# Patient Record
Sex: Female | Born: 1975 | Race: White | Hispanic: Yes | Smoking: Never smoker
Health system: Southern US, Community
[De-identification: ages and names within clinical notes are randomized; demographics above are authoritative.]

## PROBLEM LIST (undated history)

## (undated) DIAGNOSIS — J45909 Unspecified asthma, uncomplicated: Secondary | ICD-10-CM

## (undated) DIAGNOSIS — N189 Chronic kidney disease, unspecified: Secondary | ICD-10-CM

## (undated) DIAGNOSIS — E669 Obesity, unspecified: Secondary | ICD-10-CM

## (undated) DIAGNOSIS — E66811 Obesity, class 1: Secondary | ICD-10-CM

## (undated) DIAGNOSIS — K219 Gastro-esophageal reflux disease without esophagitis: Secondary | ICD-10-CM

## (undated) DIAGNOSIS — E119 Type 2 diabetes mellitus without complications: Secondary | ICD-10-CM

## (undated) DIAGNOSIS — F419 Anxiety disorder, unspecified: Secondary | ICD-10-CM

## (undated) DIAGNOSIS — E785 Hyperlipidemia, unspecified: Secondary | ICD-10-CM

## (undated) DIAGNOSIS — I10 Essential (primary) hypertension: Secondary | ICD-10-CM

## (undated) DIAGNOSIS — F439 Reaction to severe stress, unspecified: Secondary | ICD-10-CM

## (undated) DIAGNOSIS — T7840XA Allergy, unspecified, initial encounter: Secondary | ICD-10-CM

## (undated) HISTORY — DX: Hyperlipidemia, unspecified: E78.5

## (undated) HISTORY — DX: Allergy, unspecified, initial encounter: T78.40XA

## (undated) HISTORY — DX: Anxiety disorder, unspecified: F41.9

## (undated) HISTORY — DX: Obesity, unspecified: E66.9

## (undated) HISTORY — DX: Reaction to severe stress, unspecified: F43.9

## (undated) HISTORY — DX: Chronic kidney disease, unspecified: N18.9

## (undated) HISTORY — DX: Gastro-esophageal reflux disease without esophagitis: K21.9

## (undated) HISTORY — DX: Unspecified asthma, uncomplicated: J45.909

## (undated) HISTORY — DX: Obesity, class 1: E66.811

---

## 1998-04-29 ENCOUNTER — Inpatient Hospital Stay (HOSPITAL_COMMUNITY): Admission: AD | Admit: 1998-04-29 | Discharge: 1998-04-29 | Payer: Self-pay | Admitting: Obstetrics

## 1998-04-30 ENCOUNTER — Inpatient Hospital Stay (HOSPITAL_COMMUNITY): Admission: AD | Admit: 1998-04-30 | Discharge: 1998-04-30 | Payer: Self-pay | Admitting: Obstetrics & Gynecology

## 1998-04-30 ENCOUNTER — Encounter: Payer: Self-pay | Admitting: Obstetrics & Gynecology

## 1998-05-24 ENCOUNTER — Inpatient Hospital Stay (HOSPITAL_COMMUNITY): Admission: AD | Admit: 1998-05-24 | Discharge: 1998-05-26 | Payer: Self-pay | Admitting: Obstetrics & Gynecology

## 2000-02-18 ENCOUNTER — Inpatient Hospital Stay (HOSPITAL_COMMUNITY): Admission: AD | Admit: 2000-02-18 | Discharge: 2000-02-18 | Payer: Self-pay | Admitting: Obstetrics & Gynecology

## 2000-06-01 ENCOUNTER — Inpatient Hospital Stay (HOSPITAL_COMMUNITY): Admission: AD | Admit: 2000-06-01 | Discharge: 2000-06-03 | Payer: Self-pay | Admitting: Obstetrics & Gynecology

## 2000-06-09 ENCOUNTER — Emergency Department (HOSPITAL_COMMUNITY): Admission: EM | Admit: 2000-06-09 | Discharge: 2000-06-09 | Payer: Self-pay | Admitting: Emergency Medicine

## 2003-12-18 ENCOUNTER — Emergency Department (HOSPITAL_COMMUNITY): Admission: EM | Admit: 2003-12-18 | Discharge: 2003-12-18 | Payer: Self-pay

## 2004-01-06 ENCOUNTER — Ambulatory Visit: Payer: Self-pay | Admitting: *Deleted

## 2004-01-12 ENCOUNTER — Ambulatory Visit: Payer: Self-pay | Admitting: Family Medicine

## 2004-01-20 ENCOUNTER — Ambulatory Visit: Payer: Self-pay | Admitting: Family Medicine

## 2004-09-01 ENCOUNTER — Ambulatory Visit: Payer: Self-pay | Admitting: Family Medicine

## 2004-09-30 ENCOUNTER — Ambulatory Visit: Payer: Self-pay | Admitting: Family Medicine

## 2004-11-08 ENCOUNTER — Ambulatory Visit: Payer: Self-pay | Admitting: Family Medicine

## 2005-01-24 ENCOUNTER — Inpatient Hospital Stay (HOSPITAL_COMMUNITY): Admission: AD | Admit: 2005-01-24 | Discharge: 2005-01-24 | Payer: Self-pay | Admitting: Obstetrics & Gynecology

## 2005-01-26 ENCOUNTER — Inpatient Hospital Stay (HOSPITAL_COMMUNITY): Admission: AD | Admit: 2005-01-26 | Discharge: 2005-01-27 | Payer: Self-pay | Admitting: Obstetrics and Gynecology

## 2005-02-22 ENCOUNTER — Ambulatory Visit: Payer: Self-pay | Admitting: Obstetrics & Gynecology

## 2005-03-14 ENCOUNTER — Ambulatory Visit: Payer: Self-pay | Admitting: Family Medicine

## 2005-03-22 ENCOUNTER — Ambulatory Visit: Payer: Self-pay | Admitting: Family Medicine

## 2005-04-28 ENCOUNTER — Ambulatory Visit: Payer: Self-pay | Admitting: Internal Medicine

## 2005-05-11 ENCOUNTER — Ambulatory Visit (HOSPITAL_COMMUNITY): Admission: RE | Admit: 2005-05-11 | Discharge: 2005-05-11 | Payer: Self-pay | Admitting: Internal Medicine

## 2005-05-11 ENCOUNTER — Ambulatory Visit: Payer: Self-pay | Admitting: Family Medicine

## 2005-06-13 ENCOUNTER — Ambulatory Visit: Payer: Self-pay | Admitting: Family Medicine
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
## 2006-03-05 ENCOUNTER — Inpatient Hospital Stay (HOSPITAL_COMMUNITY): Admission: AD | Admit: 2006-03-05 | Discharge: 2006-03-08 | Payer: Self-pay | Admitting: Obstetrics & Gynecology

## 2006-06-02 ENCOUNTER — Ambulatory Visit: Payer: Self-pay | Admitting: Family Medicine

## 2006-08-25 ENCOUNTER — Ambulatory Visit: Payer: Self-pay | Admitting: Family Medicine

## 2006-11-16 ENCOUNTER — Ambulatory Visit: Payer: Self-pay | Admitting: Family Medicine

## 2006-12-20 ENCOUNTER — Ambulatory Visit: Payer: Self-pay | Admitting: Family Medicine

## 2006-12-20 LAB — CONVERTED CEMR LAB
Cholesterol: 162 mg/dL (ref 0–200)
HDL: 58 mg/dL (ref >39)
LDL Cholesterol: 67 mg/dL (ref 0–99)
Total CHOL/HDL Ratio: 2.8
Triglycerides: 187 mg/dL — ABNORMAL HIGH (ref <150)

## 2007-01-03 ENCOUNTER — Encounter (INDEPENDENT_AMBULATORY_CARE_PROVIDER_SITE_OTHER): Payer: Self-pay | Admitting: *Deleted

## 2007-01-10 ENCOUNTER — Ambulatory Visit: Payer: Self-pay | Admitting: Family Medicine

## 2007-02-14 ENCOUNTER — Ambulatory Visit: Payer: Self-pay | Admitting: Internal Medicine

## 2007-03-09 ENCOUNTER — Ambulatory Visit: Payer: Self-pay | Admitting: Family Medicine

## 2008-10-23 ENCOUNTER — Encounter (INDEPENDENT_AMBULATORY_CARE_PROVIDER_SITE_OTHER): Payer: Self-pay | Admitting: Family Medicine

## 2008-10-23 ENCOUNTER — Ambulatory Visit: Payer: Self-pay | Admitting: Family Medicine

## 2010-05-08 ENCOUNTER — Encounter: Payer: Self-pay | Admitting: Obstetrics & Gynecology

## 2011-06-29 ENCOUNTER — Encounter: Payer: Self-pay | Admitting: Obstetrics & Gynecology

## 2011-11-07 ENCOUNTER — Encounter: Payer: Self-pay | Admitting: Obstetrics & Gynecology

## 2011-12-01 ENCOUNTER — Encounter: Payer: Self-pay | Admitting: Obstetrics & Gynecology

## 2011-12-26 ENCOUNTER — Encounter: Payer: Self-pay | Admitting: Obstetrics & Gynecology

## 2012-03-29 ENCOUNTER — Encounter: Payer: Self-pay | Admitting: Family Medicine

## 2013-05-11 ENCOUNTER — Encounter (HOSPITAL_COMMUNITY): Payer: Self-pay | Admitting: Emergency Medicine

## 2013-05-11 ENCOUNTER — Emergency Department (HOSPITAL_COMMUNITY)
Admission: EM | Admit: 2013-05-11 | Discharge: 2013-05-11 | Disposition: A | Discharge status: Home / Self Care | Payer: No Typology Code available for payment source | Attending: Emergency Medicine | Admitting: Emergency Medicine

## 2013-05-11 DIAGNOSIS — S298XXA Other specified injuries of thorax, initial encounter: Secondary | ICD-10-CM | POA: Insufficient documentation

## 2013-05-11 DIAGNOSIS — Y9389 Activity, other specified: Secondary | ICD-10-CM | POA: Insufficient documentation

## 2013-05-11 DIAGNOSIS — S8990XA Unspecified injury of unspecified lower leg, initial encounter: Secondary | ICD-10-CM | POA: Insufficient documentation

## 2013-05-11 DIAGNOSIS — Z79899 Other long term (current) drug therapy: Secondary | ICD-10-CM | POA: Insufficient documentation

## 2013-05-11 DIAGNOSIS — S46909A Unspecified injury of unspecified muscle, fascia and tendon at shoulder and upper arm level, unspecified arm, initial encounter: Secondary | ICD-10-CM | POA: Insufficient documentation

## 2013-05-11 DIAGNOSIS — M79601 Pain in right arm: Secondary | ICD-10-CM

## 2013-05-11 DIAGNOSIS — S239XXA Sprain of unspecified parts of thorax, initial encounter: Secondary | ICD-10-CM | POA: Insufficient documentation

## 2013-05-11 DIAGNOSIS — I10 Essential (primary) hypertension: Secondary | ICD-10-CM | POA: Insufficient documentation

## 2013-05-11 DIAGNOSIS — S39012A Strain of muscle, fascia and tendon of lower back, initial encounter: Secondary | ICD-10-CM

## 2013-05-11 DIAGNOSIS — Y92410 Unspecified street and highway as the place of occurrence of the external cause: Secondary | ICD-10-CM | POA: Insufficient documentation

## 2013-05-11 DIAGNOSIS — S139XXA Sprain of joints and ligaments of unspecified parts of neck, initial encounter: Secondary | ICD-10-CM | POA: Insufficient documentation

## 2013-05-11 DIAGNOSIS — M79602 Pain in left arm: Secondary | ICD-10-CM

## 2013-05-11 DIAGNOSIS — S161XXA Strain of muscle, fascia and tendon at neck level, initial encounter: Secondary | ICD-10-CM

## 2013-05-11 DIAGNOSIS — S4980XA Other specified injuries of shoulder and upper arm, unspecified arm, initial encounter: Secondary | ICD-10-CM | POA: Insufficient documentation

## 2013-05-11 DIAGNOSIS — S99919A Unspecified injury of unspecified ankle, initial encounter: Secondary | ICD-10-CM

## 2013-05-11 DIAGNOSIS — S99929A Unspecified injury of unspecified foot, initial encounter: Secondary | ICD-10-CM

## 2013-05-11 HISTORY — DX: Essential (primary) hypertension: I10

## 2013-05-11 MED ORDER — DIAZEPAM 5 MG PO TABS: 5.0000 mg | ORAL_TABLET | Freq: Two times a day (BID) | ORAL | Status: DC

## 2013-05-11 MED ORDER — IBUPROFEN 600 MG PO TABS: 600.0000 mg | ORAL_TABLET | Freq: Four times a day (QID) | ORAL | Status: DC | PRN

## 2013-05-11 NOTE — ED Notes (Signed)
Pt states understanding of discharge instructions 
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
## 2013-05-12 NOTE — ED Provider Notes (Signed)
Medical screening examination/treatment/procedure(s) were performed by non-physician practitioner and as supervising physician I was immediately available for consultation/collaboration.  EKG Interpretation   None         Jessicamarie Amiri S Samaiyah Howes, MD 05/12/13 0112 

## 2013-10-23 ENCOUNTER — Encounter (HOSPITAL_COMMUNITY): Payer: Self-pay | Admitting: Emergency Medicine

## 2013-10-23 ENCOUNTER — Emergency Department (HOSPITAL_COMMUNITY)
Admission: EM | Admit: 2013-10-23 | Discharge: 2013-10-24 | Disposition: A | Discharge status: Home / Self Care | Payer: No Typology Code available for payment source | Attending: Emergency Medicine | Admitting: Emergency Medicine

## 2013-10-23 DIAGNOSIS — I10 Essential (primary) hypertension: Secondary | ICD-10-CM | POA: Insufficient documentation

## 2013-10-23 DIAGNOSIS — G44209 Tension-type headache, unspecified, not intractable: Secondary | ICD-10-CM | POA: Insufficient documentation

## 2013-10-23 NOTE — ED Notes (Signed)
Patient here with complaint of persistent headache for 2 months. Endorses some nausea at times. Presents tonight because headache has gotten worse.

## 2013-10-24 MED ORDER — TRAMADOL HCL 50 MG PO TABS
50.0000 mg | ORAL_TABLET | Freq: Once | ORAL | Status: AC
  Administered 2013-10-24: 50 mg via ORAL
  Filled 2013-10-24: qty 1

## 2013-10-24 MED ORDER — CYCLOBENZAPRINE HCL 10 MG PO TABS: 10.0000 mg | ORAL_TABLET | Freq: Two times a day (BID) | ORAL | Status: DC | PRN

## 2013-10-24 MED ORDER — IBUPROFEN 800 MG PO TABS
800.0000 mg | ORAL_TABLET | Freq: Once | ORAL | Status: AC
  Administered 2013-10-24: 800 mg via ORAL
  Filled 2013-10-24: qty 1

## 2013-10-24 MED ORDER — IBUPROFEN 600 MG PO TABS: 600.0000 mg | ORAL_TABLET | Freq: Four times a day (QID) | ORAL | Status: DC | PRN

## 2013-10-24 MED ORDER — TRAMADOL HCL 50 MG PO TABS: 50.0000 mg | ORAL_TABLET | Freq: Four times a day (QID) | ORAL | Status: DC | PRN

## 2013-10-24 MED ORDER — CYCLOBENZAPRINE HCL 10 MG PO TABS
5.0000 mg | ORAL_TABLET | Freq: Once | ORAL | Status: AC
  Administered 2013-10-24: 5 mg via ORAL
  Filled 2013-10-24: qty 1

## 2013-10-24 NOTE — ED Notes (Signed)
Patient is non-english speaking. Interpreter phone needed.
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
## 2013-10-24 NOTE — ED Notes (Signed)
Patient was discharged with all personal items.

## 2014-06-02 ENCOUNTER — Encounter (HOSPITAL_COMMUNITY): Payer: Self-pay

## 2014-06-02 ENCOUNTER — Emergency Department (HOSPITAL_COMMUNITY)
Admission: EM | Admit: 2014-06-02 | Discharge: 2014-06-02 | Disposition: A | Discharge status: Home / Self Care | Payer: Self-pay | Attending: Emergency Medicine | Admitting: Emergency Medicine

## 2014-06-02 ENCOUNTER — Emergency Department (HOSPITAL_COMMUNITY): Payer: Self-pay

## 2014-06-02 DIAGNOSIS — I10 Essential (primary) hypertension: Secondary | ICD-10-CM | POA: Insufficient documentation

## 2014-06-02 DIAGNOSIS — Z3202 Encounter for pregnancy test, result negative: Secondary | ICD-10-CM | POA: Insufficient documentation

## 2014-06-02 DIAGNOSIS — R51 Headache: Secondary | ICD-10-CM | POA: Insufficient documentation

## 2014-06-02 DIAGNOSIS — R079 Chest pain, unspecified: Secondary | ICD-10-CM | POA: Insufficient documentation

## 2014-06-02 DIAGNOSIS — R519 Headache, unspecified: Secondary | ICD-10-CM

## 2014-06-02 DIAGNOSIS — Z79899 Other long term (current) drug therapy: Secondary | ICD-10-CM | POA: Insufficient documentation

## 2014-06-02 LAB — BASIC METABOLIC PANEL
Anion gap: 4 — ABNORMAL LOW (ref 5–15)
BUN: 15 mg/dL (ref 6–23)
CO2: 29 mmol/L (ref 19–32)
Calcium: 8.6 mg/dL (ref 8.4–10.5)
Chloride: 104 mmol/L (ref 96–112)
Creatinine, Ser: 1.12 mg/dL — ABNORMAL HIGH (ref 0.50–1.10)
GFR calc Af Amer: 71 mL/min — ABNORMAL LOW (ref >90)
GFR calc non Af Amer: 61 mL/min — ABNORMAL LOW (ref >90)
Glucose, Bld: 100 mg/dL — ABNORMAL HIGH (ref 70–99)
Potassium: 3.6 mmol/L (ref 3.5–5.1)
Sodium: 137 mmol/L (ref 135–145)

## 2014-06-02 LAB — CBC
HCT: 35.6 % — ABNORMAL LOW (ref 36.0–46.0)
Hemoglobin: 11.3 g/dL — ABNORMAL LOW (ref 12.0–15.0)
MCH: 24.7 pg — ABNORMAL LOW (ref 26.0–34.0)
MCHC: 31.7 g/dL (ref 30.0–36.0)
MCV: 77.7 fL — ABNORMAL LOW (ref 78.0–100.0)
Platelets: 247 10*3/uL (ref 150–400)
RBC: 4.58 MIL/uL (ref 3.87–5.11)
RDW: 14.8 % (ref 11.5–15.5)
WBC: 8 10*3/uL (ref 4.0–10.5)

## 2014-06-02 LAB — POC URINE PREG, ED: Preg Test, Ur: NEGATIVE

## 2014-06-02 LAB — I-STAT TROPONIN, ED: Troponin i, poc: 0 ng/mL (ref 0.00–0.08)

## 2014-06-02 MED ORDER — ACETAMINOPHEN 325 MG PO TABS
650.0000 mg | ORAL_TABLET | Freq: Once | ORAL | Status: AC
  Administered 2014-06-02: 650 mg via ORAL
  Filled 2014-06-02: qty 2

## 2014-06-02 MED ORDER — AMLODIPINE BESYLATE 10 MG PO TABS: 10.0000 mg | ORAL_TABLET | Freq: Every day | ORAL | Status: DC

## 2014-06-02 NOTE — ED Provider Notes (Signed)
I was asked to click discharge after negative urine pregnancy   Sharyon Cable, MD 06/02/14 1723

## 2014-06-02 NOTE — ED Provider Notes (Signed)
CSN: [id number]     Arrival date & time 06/02/14  1442 History   First MD Initiated Contact with Patient 06/02/14 1525     Chief Complaint  Patient presents with  . Hypertension  . Headache  . Chest Pain     (Consider location/radiation/quality/duration/timing/severity/associated sxs/prior Treatment) HPI Comments: 39 year old female with high blood pressure, obesity history nonsmoker, no cocaine use, no alcohol abuse presents with headache, elevated blood pressure and transient chest pain. Patient has been out of her high blood pressure meds since October and does not have a primary doctor. Patient has had gradual onset intermittent headache similar to previous blood pressure was elevated at [facility]. Patient had brief chest pressure nonradiating anterior last approximately 30-45 minutes, no exertional component. Patient had brief episode this morning. Translator used for this history. No current chest pain or shortness of breath. No blood clot history, recent surgery, unilateral leg symptoms, estrogen use, hemoptysis.  Patient is a 39 y.o. female presenting with hypertension, headaches, and chest pain. The history is provided by the patient.  Hypertension Associated symptoms include chest pain and headaches. Pertinent negatives include no abdominal pain and no shortness of breath.  Headache Associated symptoms: no abdominal pain, no back pain, no congestion, no cough, no fever, no neck pain, no neck stiffness, no numbness, no vomiting and no weakness   Chest Pain Associated symptoms: headache   Associated symptoms: no abdominal pain, no back pain, no cough, no fever, no numbness, no shortness of breath, not vomiting and no weakness     Past Medical History  Diagnosis Date  . Hypertension    History reviewed. No pertinent past surgical history. History reviewed. No pertinent family history. History  Substance Use Topics  . Smoking status: Never Smoker   . Smokeless tobacco: Not on  file  . Alcohol Use: No   OB History    No data available     Review of Systems  Constitutional: Negative for fever and chills.  HENT: Negative for congestion.   Eyes: Negative for visual disturbance.  Respiratory: Negative for cough and shortness of breath.   Cardiovascular: Positive for chest pain. Negative for leg swelling.  Gastrointestinal: Negative for vomiting and abdominal pain.  Genitourinary: Negative for dysuria and flank pain.  Musculoskeletal: Negative for back pain, neck pain and neck stiffness.  Skin: Negative for rash.  Neurological: Positive for headaches. Negative for weakness, light-headedness and numbness.      Allergies  Review of patient's allergies indicates no known allergies.  Home Medications   Prior to Admission medications   Medication Sig Start Date End Date Taking? Authorizing Provider  acetaminophen (TYLENOL) 500 MG tablet Take 1,000 mg by mouth every 6 (six) hours as needed for moderate pain or headache.    Historical Provider, MD  amLODipine (NORVASC) 10 MG tablet Take 1 tablet (10 mg total) by mouth daily. 06/02/14   Mariea Clonts, MD  cyclobenzaprine (FLEXERIL) 10 MG tablet Take 1 tablet (10 mg total) by mouth 2 (two) times daily as needed for muscle spasms. 10/24/13   Elyn Peers, MD  ibuprofen (ADVIL,MOTRIN) 600 MG tablet Take 1 tablet (600 mg total) by mouth every 6 (six) hours as needed. 10/24/13   Elyn Peers, MD  Multiple Vitamin (MULTIVITAMIN) tablet Take 1 tablet by mouth daily.    Historical Provider, MD  traMADol (ULTRAM) 50 MG tablet Take 1 tablet (50 mg total) by mouth every 6 (six) hours as needed. 10/24/13   Elyn Peers, MD   BP  131/80 mmHg  Pulse 80  Temp(Src) 99.1 F (37.3 C)  Resp 12  SpO2 100% Physical Exam  Constitutional: She is oriented to person, place, and time. She appears well-developed and well-nourished.  HENT:  Head: Normocephalic and atraumatic.  Eyes: Conjunctivae are normal. Right eye exhibits no discharge.  Left eye exhibits no discharge.  Neck: Normal range of motion. Neck supple. No tracheal deviation present.  Cardiovascular: Normal rate, regular rhythm and intact distal pulses.   No murmur heard. Pulmonary/Chest: Effort normal and breath sounds normal.  Abdominal: Soft. She exhibits no distension. There is no tenderness. There is no guarding.  Musculoskeletal: She exhibits no edema.  Neurological: She is alert and oriented to person, place, and time.  Skin: Skin is warm. No rash noted.  Psychiatric: She has a normal mood and affect.  Nursing note and vitals reviewed.   ED Course  Procedures (including critical care time) Labs Review Labs Reviewed  CBC - Abnormal; Notable for the following:    Hemoglobin 11.3 (*)    HCT 35.6 (*)    MCV 77.7 (*)    MCH 24.7 (*)    All other components within normal limits  BASIC METABOLIC PANEL - Abnormal; Notable for the following:    Glucose, Bld 100 (*)    Creatinine, Ser 1.12 (*)    GFR calc non Af Amer 61 (*)    GFR calc Af Amer 71 (*)    Anion gap 4 (*)    All other components within normal limits  I-STAT TROPOININ, ED  POC URINE PREG, ED    Imaging Review Dg Chest 2 View  06/02/2014   CLINICAL DATA:  Headache and chest pain today, history of hypertension, nonsmoker.  EXAM: CHEST  2 VIEW  COMPARISON:  None.  FINDINGS: The lungs are borderline hypoinflated but clear. The heart and pulmonary vascularity are normal. The mediastinum is normal in width. There is no pleural effusion. The bony thorax is unremarkable.  IMPRESSION: There is no active cardiopulmonary disease.   Electronically Signed   By: David  Martinique   On: 06/02/2014 15:51     EKG Interpretation   Date/Time:  Monday June 02 2014 14:49:12 EST Ventricular Rate:  85 PR Interval:  156 QRS Duration: 86 QT Interval:  368 QTC Calculation: 437 R Axis:   -22 Text Interpretation:  Normal sinus rhythm Normal ECG Confirmed by Sylvana Bonk   MD, Deborah Dondero ([id number]) on 06/02/2014 3:32:00 PM       MDM   Final diagnoses:  Chest pain, unspecified chest pain type  Headache, unspecified headache type  Essential hypertension   Well-appearing female, low risk cardiac, very low risk blood clot presents after transient chest pressure this morning. PERC neg. Currently no symptoms except for mild headache gradual onset. Normal neurologic exam. Plan for Tylenol for headache, cardiac screen. Discussed close outpatient follow-up, will start blood pressure medication and stressed reasons to return. EKG and chest x-ray reviewed no acute findings. Vitals unremarkable except for mild elevated blood pressure in the room. UA pending.  Results and differential diagnosis were discussed with the patient/parent/guardian. Close follow up outpatient was discussed, comfortable with the plan.   Medications  acetaminophen (TYLENOL) tablet 650 mg (650 mg Oral Given 06/02/14 1658)    Filed Vitals:   06/02/14 1600 06/02/14 1615 06/02/14 1630 06/02/14 1645  BP: 146/88  131/80   Pulse: 80 72 73 80  Temp:      Resp: 18 14 15 12   SpO2: 100% 100% 100% 100%  Final diagnoses:  Chest pain, unspecified chest pain type  Headache, unspecified headache type  Essential hypertension        Mariea Clonts, MD 06/02/14 1705

## 2014-06-02 NOTE — ED Notes (Signed)
Pt reports being out of hypertension medication since October.  Has not be able to get a PCP.  Sts her head has been hurting her recently and checked her BP at [employer] and it was elevated.  Also reported some chest pressure on Thursday.  Pt CAO x 4 in triage.

## 2014-06-02 NOTE — Discharge Instructions (Signed)
If you were given medicines take as directed.  If you are on coumadin or contraceptives realize their levels and effectiveness is altered by many different medicines.  If you have any reaction (rash, tongues swelling, other) to the medicines stop taking and see a physician.   Please follow up as directed and return to the ER or see a physician for new or worsening symptoms.  Thank you. Filed Vitals:   06/02/14 1447 06/02/14 1538 06/02/14 1539 06/02/14 1540  BP: 198/97 135/79    Pulse: 98   75  Temp: 99.1 F (37.3 C)     Resp: 18  21 15   SpO2: 99%   100%

## 2014-07-14 ENCOUNTER — Encounter: Payer: Self-pay | Admitting: Internal Medicine

## 2014-07-14 ENCOUNTER — Ambulatory Visit: Payer: Self-pay | Attending: Internal Medicine | Admitting: Internal Medicine

## 2014-07-14 VITALS — BP 125/85 | HR 84 | Temp 97.4°F | Resp 16 | Ht 62.0 in | Wt 182.0 lb

## 2014-07-14 DIAGNOSIS — Z8049 Family history of malignant neoplasm of other genital organs: Secondary | ICD-10-CM | POA: Insufficient documentation

## 2014-07-14 DIAGNOSIS — Z6833 Body mass index (BMI) 33.0-33.9, adult: Secondary | ICD-10-CM | POA: Insufficient documentation

## 2014-07-14 DIAGNOSIS — I10 Essential (primary) hypertension: Secondary | ICD-10-CM

## 2014-07-14 DIAGNOSIS — L83 Acanthosis nigricans: Secondary | ICD-10-CM

## 2014-07-14 DIAGNOSIS — Z713 Dietary counseling and surveillance: Secondary | ICD-10-CM | POA: Insufficient documentation

## 2014-07-14 DIAGNOSIS — N6002 Solitary cyst of left breast: Secondary | ICD-10-CM

## 2014-07-14 DIAGNOSIS — E8881 Metabolic syndrome: Secondary | ICD-10-CM | POA: Insufficient documentation

## 2014-07-14 DIAGNOSIS — L918 Other hypertrophic disorders of the skin: Secondary | ICD-10-CM

## 2014-07-14 DIAGNOSIS — L304 Erythema intertrigo: Secondary | ICD-10-CM

## 2014-07-14 DIAGNOSIS — B351 Tinea unguium: Secondary | ICD-10-CM

## 2014-07-14 DIAGNOSIS — Z833 Family history of diabetes mellitus: Secondary | ICD-10-CM | POA: Insufficient documentation

## 2014-07-14 LAB — COMPLETE METABOLIC PANEL WITH GFR
ALBUMIN: 3.8 g/dL (ref 3.5–5.2)
ALT: 17 U/L (ref 0–35)
AST: 15 U/L (ref 0–37)
Alkaline Phosphatase: 74 U/L (ref 39–117)
BILIRUBIN TOTAL: 0.3 mg/dL (ref 0.2–1.2)
BUN: 19 mg/dL (ref 6–23)
CHLORIDE: 104 meq/L (ref 96–112)
CO2: 27 meq/L (ref 19–32)
CREATININE: 0.84 mg/dL (ref 0.50–1.10)
Calcium: 8.7 mg/dL (ref 8.4–10.5)
GFR, EST NON AFRICAN AMERICAN: 88 mL/min
GFR, Est African American: >89 mL/min
Glucose, Bld: 118 mg/dL — ABNORMAL HIGH (ref 70–99)
Potassium: 4.5 mEq/L (ref 3.5–5.3)
SODIUM: 141 meq/L (ref 135–145)
Total Protein: 7.1 g/dL (ref 6.0–8.3)

## 2014-07-14 LAB — HEMOGLOBIN A1C
Hgb A1c MFr Bld: 6.8 % — ABNORMAL HIGH (ref <5.7)
Mean Plasma Glucose: 148 mg/dL — ABNORMAL HIGH (ref <117)

## 2014-07-14 LAB — LIPID PANEL
Cholesterol: 168 mg/dL (ref 0–200)
HDL: 52 mg/dL (ref >=46)
LDL CALC: 84 mg/dL (ref 0–99)
TRIGLYCERIDES: 158 mg/dL — AB (ref <150)
Total CHOL/HDL Ratio: 3.2 Ratio
VLDL: 32 mg/dL (ref 0–40)

## 2014-07-14 LAB — TSH: TSH: 2.445 u[IU]/mL (ref 0.350–4.500)

## 2014-07-14 MED ORDER — LOSARTAN POTASSIUM 50 MG PO TABS: 50.0000 mg | ORAL_TABLET | Freq: Every day | ORAL | Status: DC

## 2014-07-14 MED ORDER — NYSTATIN 100000 UNIT/GM EX POWD: CUTANEOUS | Status: DC

## 2014-07-14 NOTE — Progress Notes (Signed)
Patient ID: Jennifer Dunn, female   DOB: [birth year], 39 y.o.   MRN: [id number]  ZTI:[id number]  ASN:[id number]  DOB - Aug 21, 1975  CC:  Chief Complaint  Patient presents with  . Establish Care       HPI: Jennifer Dunn is a 39 y.o. female here today to establish medical care.  She has a past medical history of hypertension and is on amlodipine. She notes that she has had some ankle swelling since beginning amlodipine last month. She reports that she has been having left breast tenderness for the past 3-4 months.  The pain is not associated with menstrual periods.  The pain is sharp and happens several times per day. She noticed a knot in her breast. She denies any family history of breast cancer but admits to a family history of uterine cancer. Last pap smear was 2 years ago which was normal. Patient's last menstrual period was 06/30/2014.   Strong family history of DM. She denies symptoms of blurred vision, dizziness, polyuria, polydipsia. She is concerned about dark, brittle toenails for the past 2 years.    Patient has No headache, No chest pain, No abdominal pain - No Nausea, No new weakness tingling or numbness, No Cough - SOB.  No Known Allergies Past Medical History  Diagnosis Date  . Hypertension    Current Outpatient Prescriptions on File Prior to Visit  Medication Sig Dispense Refill  . acetaminophen (TYLENOL) 500 MG tablet Take 1,000 mg by mouth every 6 (six) hours as needed for moderate pain or headache.    Marland Kitchen amLODipine (NORVASC) 10 MG tablet Take 1 tablet (10 mg total) by mouth daily. 60 tablet 1  . cyclobenzaprine (FLEXERIL) 10 MG tablet Take 1 tablet (10 mg total) by mouth 2 (two) times daily as needed for muscle spasms. (Patient not taking: Reported on 07/14/2014) 20 tablet 0  . ibuprofen (ADVIL,MOTRIN) 600 MG tablet Take 1 tablet (600 mg total) by mouth every 6 (six) hours as needed. (Patient not taking: Reported on 07/14/2014) 30 tablet 0  . Multiple Vitamin  (MULTIVITAMIN) tablet Take 1 tablet by mouth daily.    . traMADol (ULTRAM) 50 MG tablet Take 1 tablet (50 mg total) by mouth every 6 (six) hours as needed. (Patient not taking: Reported on 07/14/2014) 15 tablet 0   No current facility-administered medications on file prior to visit.   History reviewed. No pertinent family history. History   Social History  . Marital Status: Single    Spouse Name: N/A  . Number of Children: N/A  . Years of Education: N/A   Occupational History  . Not on file.   Social History Main Topics  . Smoking status: Never Smoker   . Smokeless tobacco: Not on file  . Alcohol Use: No  . Drug Use: No  . Sexual Activity: Not on file   Other Topics Concern  . Not on file   Social History Narrative    Review of Systems: See HPI.    Objective:   Filed Vitals:   07/14/14 0916  BP: 125/85  Pulse: 84  Temp: 97.4 F (36.3 C)  Resp: 16    Physical Exam  Constitutional: She is oriented to person, place, and time.  HENT:  Acanthosis nigracans  Neck: No thyromegaly present.  Cardiovascular: Normal rate, regular rhythm and normal heart sounds.   Pulmonary/Chest: Effort normal and breath sounds normal. Right breast exhibits no mass, no nipple discharge, no skin change and no tenderness. Left breast exhibits mass and tenderness.  Left breast exhibits no nipple discharge and no skin change.    Abdominal: Soft. Bowel sounds are normal.  Musculoskeletal: She exhibits edema (trace BLE).  Neurological: She is alert and oriented to person, place, and time.  Skin: Skin is warm and dry.  Several skin tags under bilateral axilla with hyperpigmentations  Psychiatric: She has a normal mood and affect.     Lab Results  Component Value Date   WBC 8.0 06/02/2014   HGB 11.3* 06/02/2014   HCT 35.6* 06/02/2014   MCV 77.7* 06/02/2014   PLT 247 06/02/2014   Lab Results  Component Value Date   CREATININE 1.12* 06/02/2014   BUN 15 06/02/2014   NA 137  06/02/2014   K 3.6 06/02/2014   CL 104 06/02/2014   CO2 29 06/02/2014    No results found for: HGBA1C Lipid Panel     Component Value Date/Time   CHOL 162 12/20/2006 2229   TRIG 187* 12/20/2006 2229   HDL 58 12/20/2006 2229   CHOLHDL 2.8 Ratio 12/20/2006 2229   VLDL 37 12/20/2006 2229   LDLCALC 67 12/20/2006 2229       Assessment and plan:   Jennifer Dunn was seen today for establish care.  Diagnoses and all orders for this visit:  Essential hypertension Orders: -     losartan (COZAAR) 50 MG tablet; Take 1 tablet (50 mg total) by mouth daily. Will d/c amlodipine due to lower extremity swelling. Will switch to losartan and reassess in 2 weeks. Explained to patient diet modifications that may contribute to elevated BP. Patient will avoid foods that are high in sodium such as  canned soups and vegetables, tomato juice, commercial baked goods, commercially prepared frozen or canned entrees and sauces. Avoid salty snacks, added salt when cooking, and substituting for low sodium herbs or spices Explained exercise regimen of cardio at least three times weekly to help lower BP and cholesterol.  Acanthosis nigricans Orders: -     Hemoglobin A1c Patient shows early signs of insulin resistance   Morbid obesity Weight loss discussed at length and its complications to health.  Patient will loss 10 months by next visit in 3 months.  Diet and exercise discussed as well as calorie intake.  Breast cyst, left Orders: -     MM Digital Diagnostic Bilat; Future  Skin tag Orders: -     Ambulatory referral to Dermatology  Onychomycosis Orders: -     COMPLETE METABOLIC PANEL WITH GFR -     Lipid panel -     TSH -     Vitamin D, 25-hydroxy Will send oral lamisil once LFT's result  Intertrigo Orders: -     nystatin (MYCOSTATIN/NYSTOP) 100000 UNIT/GM POWD; May use under arms/breast twice daily   Return in about 2 weeks (around 07/28/2014) for pap smear.    Chari Manning, [facility] [phone] 07/14/2014, 9:59 AM

## 2014-07-14 NOTE — Patient Instructions (Signed)
Leodis Sias  (Breast Sonogram) Es una ecografa de la mama para determinar si un bulto es un quiste (una cavidad llena de lquido), una masa slida o un tumor. Se indica para diagnosticar si un tumor es benigno (no canceroso) o canceroso. Tambin permite localizar un ndulo pequeo que no puede palparse, de modo que pueda localizarse para ser extirpado [employer].  PREPARACIN PARA EL ESTUDIO  No se requiere una preparacin especial.  HALLAZGOS NORMALES:  No hay evidencia de quiste o tumor.  Los rangos para los resultados normales pueden variar entre diferentes laboratorios y hospitales. Consulte siempre con su mdico despus de Psychologist, counselling estudio para Armed forces logistics/support/administrative officer significado de los [place] y si los valores se consideran "dentro de los lmites normales".  SIGNIFICADO DEL ESTUDIO  El mdico leer los resultados y Teacher, English as a foreign language con usted la importancia y el significado de los [place], as como las opciones de tratamiento y la necesidad de pruebas adicionales, si fuera necesario.  OBTENCIN DE LOS RESULTADOS DE LAS PRUEBAS  Es su responsabilidad retirar el resultado del [place]. Consulte en el laboratorio cundo y cmo podr [employer].  Document Released: 07/30/2012 [facility] Patient Information 2015 [place]. This information is not intended to replace advice given to you by your health care provider. Make sure you discuss any questions you have with your health care provider.

## 2014-07-14 NOTE — Progress Notes (Signed)
Pt is here to establish care. Pt has a history of HTN. Pt is c/o tenderness in her left breast.

## 2014-07-15 ENCOUNTER — Telehealth (HOSPITAL_COMMUNITY): Payer: Self-pay | Admitting: *Deleted

## 2014-07-15 LAB — VITAMIN D 25 HYDROXY (VIT D DEFICIENCY, FRACTURES): VIT D 25 HYDROXY: 16 ng/mL — AB (ref 30–100)

## 2014-07-15 NOTE — Telephone Encounter (Signed)
Telephoned patient at home # and was not a valid #.

## 2014-07-22 ENCOUNTER — Telehealth (HOSPITAL_COMMUNITY): Payer: Self-pay | Admitting: *Deleted

## 2014-07-22 ENCOUNTER — Other Ambulatory Visit: Payer: Self-pay | Admitting: Internal Medicine

## 2014-07-22 DIAGNOSIS — N6002 Solitary cyst of left breast: Secondary | ICD-10-CM

## 2014-07-22 NOTE — Telephone Encounter (Signed)
Telephoned patient at home # and # is invalid.

## 2014-07-28 ENCOUNTER — Other Ambulatory Visit: Payer: Self-pay | Admitting: Internal Medicine

## 2014-07-31 ENCOUNTER — Ambulatory Visit: Payer: Self-pay

## 2014-08-05 ENCOUNTER — Telehealth: Payer: Self-pay | Admitting: *Deleted

## 2014-08-05 NOTE — Telephone Encounter (Signed)
-----   Message from Lance Bosch, NP sent at 07/25/2014 10:13 PM EDT ----- patient tested positive for diabetes mellitus type 2. Please provide patient with appropriate education and begin her on metformin XR 500 mg once daily with breakfast. Please have patient come back for nurse visit with Lauren for appropriate education. Reason for patient that metformin may cause some GI upset for the first week but should resolve after that. Cholesterol looks good.  Vitamin D is low. Please send drisdol 50,000 IU to take once weekly for 10 weeks. 10 tablets no refills.

## 2014-08-05 NOTE — Telephone Encounter (Signed)
Phone number provided does not work.

## 2014-08-14 ENCOUNTER — Ambulatory Visit: Payer: No Typology Code available for payment source | Attending: Internal Medicine | Admitting: Internal Medicine

## 2014-08-14 ENCOUNTER — Encounter: Payer: Self-pay | Admitting: Internal Medicine

## 2014-08-14 VITALS — BP 153/109 | HR 85 | Temp 98.0°F | Resp 16

## 2014-08-14 DIAGNOSIS — B351 Tinea unguium: Secondary | ICD-10-CM

## 2014-08-14 DIAGNOSIS — N925 Other specified irregular menstruation: Secondary | ICD-10-CM | POA: Insufficient documentation

## 2014-08-14 DIAGNOSIS — Z124 Encounter for screening for malignant neoplasm of cervix: Secondary | ICD-10-CM

## 2014-08-14 DIAGNOSIS — L304 Erythema intertrigo: Secondary | ICD-10-CM

## 2014-08-14 DIAGNOSIS — E119 Type 2 diabetes mellitus without complications: Secondary | ICD-10-CM

## 2014-08-14 DIAGNOSIS — I10 Essential (primary) hypertension: Secondary | ICD-10-CM | POA: Insufficient documentation

## 2014-08-14 LAB — POCT URINALYSIS DIPSTICK
Bilirubin, UA: NEGATIVE
Glucose, UA: NEGATIVE
Ketones, UA: NEGATIVE
NITRITE UA: NEGATIVE
Protein, UA: >=300
Spec Grav, UA: >=1.03
UROBILINOGEN UA: 0.2
pH, UA: 5.5

## 2014-08-14 LAB — POCT URINE PREGNANCY: Preg Test, Ur: NEGATIVE

## 2014-08-14 MED ORDER — METFORMIN HCL ER 500 MG PO TB24: 500.0000 mg | ORAL_TABLET | Freq: Every day | ORAL | Status: DC

## 2014-08-14 MED ORDER — FREESTYLE LANCETS MISC: Status: DC

## 2014-08-14 MED ORDER — FREESTYLE SYSTEM KIT: 1.0000 | PACK | Status: DC | PRN

## 2014-08-14 MED ORDER — GLUCOSE BLOOD VI STRP: ORAL_STRIP | Status: DC

## 2014-08-14 MED ORDER — NYSTATIN 100000 UNIT/GM EX POWD: CUTANEOUS | Status: DC

## 2014-08-14 MED ORDER — TERBINAFINE HCL 250 MG PO TABS: 250.0000 mg | ORAL_TABLET | Freq: Every day | ORAL | Status: DC

## 2014-08-14 NOTE — Patient Instructions (Addendum)
Please call Rolena Infante, [phone],  with the BCCCP (breast and cervical cancer control program) at the [facility] to set up an appointment to verify eligibility for a breast exam, mammogram, ultrasound. If you qualify this will be set up at [facility].  Diabetes y normas bsicas de atencin mdica (Diabetes and Standards of Medical Care) La diabetes es una enfermedad complicada. El equipo que trate su diabetes deber incluir un nutricionista, un enfermero, un educador para la diabetes, un oftalmlogo y ms. Para que todas las personas conozcan sobre su enfermedad y para que los pacientes tengan los cuidados que necesitan, se crearon las siguientes normas bsicas para un mejor control. A continuacin se indican los estudios, vacunas, medicamentos, educacin y planes que necesitar. Prueba de HbA1c Esta prueba muestra cmo ha sido controlada su glucosa en los ltimos 2 o 3 meses. Se utiliza para verificar si el plan de control de la diabetes debe ser ajustado.   Hgalos al menos 2 veces al ao si cumple los objetivos del [place].  Si le han cambiado el tratamiento o si no cumple con los objetivos del [place], debe hacerlo 4 veces al ao. Control de la presin arterial.  Hgalas en cada visita mdica de rutina. El objetivo es tener menos de 140/90 mm Hg en la mayora de las personas, pero 130/80 mm Hg en algunos casos. Consulte a su mdico acerca de su objetivo. Examen dental.  Concurra regularmente a las visitas de control con el dentista. Examen ocular.  Si le diagnosticaron diabetes tipo 1 siendo un nio, debe hacerse estudios al llegar a los 10 aos o ms y si ha sufrido de diabetes durante 3 a 5 aos. Se recomienda hacer anualmente los exmenes oculares despus de ese examen inicial.  Si le diagnosticaron diabetes tipo 1 siendo adulto, hgase un examen dentro de los 5 aos del diagnstico y luego una vez por ao.  Si le diagnosticaron diabetes tipo 2, hgase un estudio lo  antes posible despus del diagnstico y luego una vez por ao. Examen de los pies  Se har una inspeccin visual en cada visita mdica de rutina. Estos controles observarn si hay cortes, lesiones u otros problemas en los pies.  Una vez por ao debe hacerse un examen ms intensivo. Este examen incluye la inspeccin visual y Ardelia Mems evaluacin de los pulsos del pie y la sensibilidad.  Contrlese los pies todas las noches para ver si hay cortes, lesiones u otros problemas. Comunquese con su mdico si observa que no se curan. Estudio de la funcin renal (microalbmina en orina)  Debe realizarse una vez por ao.  Diabetes tipo 1: La primera prueba se realiza a los 5 aos despus del diagnstico.  Diabetes tipo2: La primera prueba se realiza en el momento del diagnstico.  La creatinina srica y el ndice de filtracin glomerular estimada (eGFR, por sus siglas en ingls) se realizan una vez por ao para informar el nivel de enfermedad renal crnica, si la hubiera. Perfil lipdico (colesterol, HDL, LDL, triglicridos).  La mayora de las personas lo hacen cada 5 aos.  En relacin al LDL, el objetivo es tener menos de 100 mg/dl. Si tiene alto riesgo, el objetivo es tener menos de 70 mg/dl.  En relacin al HDL, el objetivo es [employer] 40 y 54 mg/dl para los hombres y entre 14 y 73 mg/dl para las mujeres. Un nivel de colesterol HDL de 60 mg/dl o superior da una cierta proteccin contra la enfermedad cardaca.  En relacin a los  triglicridos, el objetivo es tener menos de 150 mg/dl. Vacuna contra la gripe, contra el neumococo y contra la hepatitis B  Se recomienda aplicarse la vacuna contra la gripe anualmente.  Se recomienda que las [employer] de 65aos con diabetes se apliquen la vacuna contra la neumona. En algunos casos, pueden administrarse dos inyecciones separadas. Pregntele al mdico si tiene la vacuna contra la neumona al da.  Tambin se recomienda la aplicacin de la  vacuna contra la hepatitis B en los adultos con diabetes. Educacin para el autocontrol de la diabetes  Recomendaciones al momento del diagnstico y los controles segn sea necesario. Plan de tratamiento  Su plan de tratamiento ser revisado en cada visita mdica. Document Released: 06/29/2009 Document Revised: 08/19/2013 [facility] Patient Information 2015 [place]. This information is not intended to replace advice given to you by your health care provider. Make sure you discuss any questions you have with your health care provider.

## 2014-08-14 NOTE — Progress Notes (Signed)
Patient ID: Jennifer Dunn, female   DOB: 29-Mar-1976, 38 y.o.   MRN: [id number]  CC: pap, toe fungus, mammogram  HPI: Jennifer Dunn is a 39 y.o. female here today for a follow up visit.  Patient has past medical history of hypertension and T2DM.  She reports that she has been having irregular periods with the last one on 08/10/14 lasting only one day.  Patient is requesting a mammogram. Patient would like a pap smear today for vaginal itching. She has not had a pap smear in the past 2 years and has no history of abnormal paps. She denies vaginal discharge, odor, or lesions. No new sexual partners.  She is concerned with a toe nail fungus on bilateral feet that have been present for a while. She c/o long thick toenails that are occasionaly itchy.    Patient has No headache, No chest pain, No abdominal pain - No Nausea, No new weakness tingling or numbness, No Cough - SOB.  No Known Allergies Past Medical History  Diagnosis Date  . Hypertension    Current Outpatient Prescriptions on File Prior to Visit  Medication Sig Dispense Refill  . losartan (COZAAR) 50 MG tablet Take 1 tablet (50 mg total) by mouth daily. 30 tablet 3  . acetaminophen (TYLENOL) 500 MG tablet Take 1,000 mg by mouth every 6 (six) hours as needed for moderate pain or headache.    . cyclobenzaprine (FLEXERIL) 10 MG tablet Take 1 tablet (10 mg total) by mouth 2 (two) times daily as needed for muscle spasms. (Patient not taking: Reported on 07/14/2014) 20 tablet 0  . ibuprofen (ADVIL,MOTRIN) 600 MG tablet Take 1 tablet (600 mg total) by mouth every 6 (six) hours as needed. (Patient not taking: Reported on 07/14/2014) 30 tablet 0  . Multiple Vitamin (MULTIVITAMIN) tablet Take 1 tablet by mouth daily.    Marland Kitchen nystatin (MYCOSTATIN/NYSTOP) 100000 UNIT/GM POWD May use under arms/breast twice daily (Patient not taking: Reported on 08/14/2014) 60 g 0  . traMADol (ULTRAM) 50 MG tablet Take 1 tablet (50 mg total) by mouth every 6  (six) hours as needed. (Patient not taking: Reported on 07/14/2014) 15 tablet 0   No current facility-administered medications on file prior to visit.   History reviewed. No pertinent family history. History   Social History  . Marital Status: Single    Spouse Name: N/A  . Number of Children: N/A  . Years of Education: N/A   Occupational History  . Not on file.   Social History Main Topics  . Smoking status: Never Smoker   . Smokeless tobacco: Not on file  . Alcohol Use: No  . Drug Use: No  . Sexual Activity: Not on file   Other Topics Concern  . Not on file   Social History Narrative    Review of Systems  Eyes: Positive for blurred vision.  Cardiovascular: Positive for claudication.  Genitourinary: Positive for frequency.  Skin: Positive for rash.  Neurological: Positive for tingling.  Endo/Heme/Allergies: Positive for polydipsia.  All other systems reviewed and are negative.    Objective:   Filed Vitals:   08/14/14 1639  BP: 153/109  Pulse: 85  Temp: 98 F (36.7 C)  Resp: 16    Physical Exam  Constitutional: She is oriented to person, place, and time.  Cardiovascular: Normal rate, regular rhythm and normal heart sounds.   Pulmonary/Chest: Effort normal and breath sounds normal. Right breast exhibits no mass and no skin change. Left breast exhibits no mass and no  skin change.  Abdominal: Soft. Bowel sounds are normal.  Genitourinary: Vagina normal and uterus normal. No breast tenderness or discharge. Cervix exhibits no motion tenderness, no discharge and no friability. Right adnexum displays no tenderness. Left adnexum displays no tenderness.  Lymphadenopathy:       Right: No inguinal adenopathy present.       Left: No inguinal adenopathy present.  Neurological: She is alert and oriented to person, place, and time.  Skin: Skin is warm and dry.     Lab Results  Component Value Date   WBC 8.0 06/02/2014   HGB 11.3* 06/02/2014   HCT 35.6* 06/02/2014    MCV 77.7* 06/02/2014   PLT 247 06/02/2014   Lab Results  Component Value Date   CREATININE 0.84 07/14/2014   BUN 19 07/14/2014   NA 141 07/14/2014   K 4.5 07/14/2014   CL 104 07/14/2014   CO2 27 07/14/2014    Lab Results  Component Value Date   HGBA1C 6.8* 07/14/2014   Lipid Panel     Component Value Date/Time   CHOL 168 07/14/2014 1045   TRIG 158* 07/14/2014 1045   HDL 52 07/14/2014 1045   CHOLHDL 3.2 07/14/2014 1045   VLDL 32 07/14/2014 1045   LDLCALC 84 07/14/2014 1045       Assessment and plan:  Jennifer Dunn was seen today for follow-up.  Diagnoses and all orders for this visit:  Type 2 diabetes mellitus without complication Orders: -     glucose monitoring kit (FREESTYLE) monitoring kit; 1 each by Does not apply route as needed. -     glucose blood test strip; Use as instructed -     Lancets (FREESTYLE) lancets; Use as instructed -   Refill metFORMIN (GLUCOPHAGE XR) 500 MG 24 hr tablet; Take 1 tablet (500 mg total) by mouth daily with breakfast. -     Microalbumin, urine -     POCT urine pregnancy -     Cytology - PAP [place] -     COMPLETE METABOLIC PANEL WITH GFR; Future  Intertrigo Orders: -     nystatin (MYCOSTATIN/NYSTOP) 100000 UNIT/GM POWD; May use under arms/breast twice daily  Papanicolaou smear Orders: -     POCT urinalysis dipstick -     Cervicovaginal ancillary only -     Cervicovaginal ancillary only  Onychomycosis Orders: -     terbinafine (LAMISIL) 250 MG tablet; Take 1 tablet (250 mg total) by mouth daily. Will return in one month for LFT test. At that time I will give patient 2 more months of medication to complete therapy.  Return in about 4 weeks (around 09/11/2014) for Lab Visit AND 3 MO pcp.       Chari Manning, NP-C [facility] [phone] 08/14/2014, 5:04 PM

## 2014-08-14 NOTE — Progress Notes (Signed)
Pt is here for a pap smear. Pt has a fungus infection on all her toenails. Pt reports having abdomen pain for 3 days. Pt said that she was suppose to have a MM but never received an appointment.

## 2014-08-15 LAB — MICROALBUMIN, URINE: MICROALB UR: 143.5 mg/dL — AB (ref <2.0)

## 2014-08-15 LAB — CERVICOVAGINAL ANCILLARY ONLY: Wet Prep (BD Affirm): NEGATIVE

## 2014-08-18 LAB — CYTOLOGY - PAP

## 2014-08-18 LAB — CERVICOVAGINAL ANCILLARY ONLY
CHLAMYDIA, DNA PROBE: NEGATIVE
Neisseria Gonorrhea: NEGATIVE

## 2014-08-19 ENCOUNTER — Telehealth: Payer: Self-pay | Admitting: *Deleted

## 2014-08-19 NOTE — Telephone Encounter (Signed)
Pt's phone not accepting VM. Unable to leave message.

## 2014-08-19 NOTE — Telephone Encounter (Signed)
-----   Message from Lance Bosch, NP sent at 08/18/2014  7:26 PM EDT ----- Negative for infections on pap, still waiting on cytology

## 2014-08-20 ENCOUNTER — Telehealth: Payer: Self-pay | Admitting: Internal Medicine

## 2014-08-20 NOTE — Telephone Encounter (Signed)
Patient called returning nurse's phone call to review results. Please f/u °

## 2014-08-25 ENCOUNTER — Telehealth: Payer: Self-pay | Admitting: *Deleted

## 2014-08-25 NOTE — Telephone Encounter (Signed)
-----   Message from Lance Bosch, NP sent at 08/21/2014  7:48 AM EDT ----- Negative cytology

## 2014-08-25 NOTE — Telephone Encounter (Signed)
Phone numbers provided are not working.

## 2014-09-03 ENCOUNTER — Ambulatory Visit: Payer: Self-pay | Attending: Internal Medicine

## 2014-09-03 NOTE — Telephone Encounter (Signed)
Patient came into office requesting pap smear results, please f/u with patient

## 2014-09-08 ENCOUNTER — Other Ambulatory Visit (HOSPITAL_COMMUNITY): Payer: Self-pay | Admitting: *Deleted

## 2014-09-08 DIAGNOSIS — N6320 Unspecified lump in the left breast, unspecified quadrant: Secondary | ICD-10-CM

## 2014-09-12 ENCOUNTER — Ambulatory Visit: Payer: No Typology Code available for payment source | Attending: Internal Medicine

## 2014-09-12 DIAGNOSIS — E119 Type 2 diabetes mellitus without complications: Secondary | ICD-10-CM

## 2014-09-12 LAB — COMPLETE METABOLIC PANEL WITH GFR
ALBUMIN: 3.5 g/dL (ref 3.5–5.2)
ALT: 16 U/L (ref 0–35)
AST: 16 U/L (ref 0–37)
Alkaline Phosphatase: 88 U/L (ref 39–117)
BILIRUBIN TOTAL: 0.3 mg/dL (ref 0.2–1.2)
BUN: 14 mg/dL (ref 6–23)
CO2: 28 mEq/L (ref 19–32)
Calcium: 8.9 mg/dL (ref 8.4–10.5)
Chloride: 102 mEq/L (ref 96–112)
Creat: 0.87 mg/dL (ref 0.50–1.10)
GFR, EST NON AFRICAN AMERICAN: 84 mL/min
GFR, Est African American: >89 mL/min
Glucose, Bld: 96 mg/dL (ref 70–99)
POTASSIUM: 4.4 meq/L (ref 3.5–5.3)
SODIUM: 139 meq/L (ref 135–145)
Total Protein: 7.1 g/dL (ref 6.0–8.3)

## 2014-09-15 ENCOUNTER — Other Ambulatory Visit: Payer: Self-pay | Admitting: Internal Medicine

## 2014-09-15 DIAGNOSIS — B351 Tinea unguium: Secondary | ICD-10-CM

## 2014-09-15 MED ORDER — TERBINAFINE HCL 250 MG PO TABS: 250.0000 mg | ORAL_TABLET | Freq: Every day | ORAL | Status: DC

## 2014-09-17 ENCOUNTER — Telehealth: Payer: Self-pay | Admitting: *Deleted

## 2014-09-17 NOTE — Telephone Encounter (Signed)
-----   Message from Lance Bosch, NP sent at 09/15/2014 12:49 PM EDT ----- Liver test is normal, I have sent in last 2 months of Lamisil for foot fungus.

## 2014-09-17 NOTE — Telephone Encounter (Signed)
PI [id number] and results relayed to patient.  Patient states she will pick up the medication.

## 2014-09-18 ENCOUNTER — Ambulatory Visit (HOSPITAL_COMMUNITY): Payer: No Typology Code available for payment source

## 2014-09-18 ENCOUNTER — Other Ambulatory Visit: Payer: Self-pay

## 2014-10-09 ENCOUNTER — Ambulatory Visit (HOSPITAL_COMMUNITY)
Admission: RE | Admit: 2014-10-09 | Discharge: 2014-10-09 | Disposition: A | Discharge status: Home / Self Care | Payer: No Typology Code available for payment source | Source: Ambulatory Visit | Attending: Obstetrics and Gynecology | Admitting: Obstetrics and Gynecology

## 2014-10-09 DIAGNOSIS — Z1239 Encounter for other screening for malignant neoplasm of breast: Secondary | ICD-10-CM

## 2014-10-09 NOTE — Progress Notes (Signed)
CLINIC:  [facility]) [facility]  REASON FOR VISIT: Well-woman exam and diagnostic mammogram.    HISTORY OF PRESENT ILLNESS:  [name] is a 39 y.o. female who presents to the [facility] today for clinical breast exam. No family history of breast cancer. She reports a left breast lump x 6 months that is intermittently painful to 7-8/10.  She denies any breast skin changes or nipple discharge.  Her last pap smear was in 07/2014 and was negative.  She has no history of abnormal pap smears.   REVIEW OF SYSTEMS:  Left breast lump per HPI. Otherwise, no other complaints. Denies any nodularity, skin changes, or nipple inversion/discharge to the right breast.   ALLERGIES: No Known Allergies  CURRENT MEDICATIONS:  Current Outpatient Prescriptions on File Prior to Encounter  Medication Sig Dispense Refill  . acetaminophen (TYLENOL) 500 MG tablet Take 1,000 mg by mouth every 6 (six) hours as needed for moderate pain or headache.    . cyclobenzaprine (FLEXERIL) 10 MG tablet Take 1 tablet (10 mg total) by mouth 2 (two) times daily as needed for muscle spasms. (Patient not taking: Reported on 07/14/2014) 20 tablet 0  . glucose blood test strip Use as instructed 100 each 12  . glucose monitoring kit (FREESTYLE) monitoring kit 1 each by Does not apply route as needed. 1 each 0  . ibuprofen (ADVIL,MOTRIN) 600 MG tablet Take 1 tablet (600 mg total) by mouth every 6 (six) hours as needed. (Patient not taking: Reported on 07/14/2014) 30 tablet 0  . Lancets (FREESTYLE) lancets Use as instructed 100 each 12  . losartan (COZAAR) 50 MG tablet Take 1 tablet (50 mg total) by mouth daily. 30 tablet 3  . metFORMIN (GLUCOPHAGE XR) 500 MG 24 hr tablet Take 1 tablet (500 mg total) by mouth daily with breakfast. 30 tablet 5  . Multiple Vitamin (MULTIVITAMIN) tablet Take 1 tablet by mouth daily.    Marland Kitchen nystatin (MYCOSTATIN/NYSTOP) 100000 UNIT/GM POWD May use under arms/breast twice  daily 60 g 1  . terbinafine (LAMISIL) 250 MG tablet Take 1 tablet (250 mg total) by mouth daily. 30 tablet 1  . traMADol (ULTRAM) 50 MG tablet Take 1 tablet (50 mg total) by mouth every 6 (six) hours as needed. (Patient not taking: Reported on 07/14/2014) 15 tablet 0   No current facility-administered medications on file prior to encounter.     PHYSICAL EXAM:  Vitals:  BP: 128/84 Temp: 98.4 Weight: 192 lbs Height: 62" General: Well-nourished, well-appearing female in no acute distress.  She is unaccompanied in clinic today.  Rolena Infante, LPN and Anastasio Auerbach, Spanish language interpreter were present during physical exam for this patient.  Breasts: Bilateral breasts exposed and observed with patient standing (arms at side, arms on hips, arms on hips flexed forward, and arms over head).  No gross abnormalities including breast skin puckering or dimpling noted on observation.  Right breast larger than left. No evidence of skin redness, thickening, or peau d'orange appearance. No nipple retraction or nipple discharge noted bilaterally.  No breast nodularity palpated in bilateral breasts.  Normal fibrocystic breast changes noted in bilateral breasts.  The area of concern for the patient was at 9 o'clock position in left breast.  Normal breast glandular tissue palpated in this location. Axillary lymph nodes: No axillary lymphadenopathy bilaterally.  There are bilateral prominent lymph nodes, but are symmetric on both axilla/Tail of [place] breast areas.  No suspicious findings on physical exam. There are multiple  large skin tags to bilateral axilla and lateral breasts.  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  GU: Exam deferred. Pap smear is up-to-date.  ASSESSMENT & PLAN:   1. Breast cancer screening: [name] has no palpable breast abnormalities on her clinical breast exam today.  She will receive her diagnostic mammogram as scheduled.  She will be contacted by the imaging  center for results of the mammogram. She was given instructions and educational materials regarding breast self-awareness. [name] is aware of this plan and agrees with it.   2. Skin tags: Patient expressed concern regarding prominent skin tags to bilateral axilla.  I explained to [name] that the skin tags are likely related to her recent diagnosis of diabetes/insulin resistance.  I encouraged her to seek care with a primary care/dermatologist to discuss having the skin tags removed, as they bother her, as well as adequate glucose monitoring. [name] agrees with this plan.    [name] was encouraged to ask questions and all questions were answered to her satisfaction.    Mike Craze, NP [place]  [phone]

## 2014-10-10 ENCOUNTER — Ambulatory Visit
Admission: RE | Admit: 2014-10-10 | Discharge: 2014-10-10 | Disposition: A | Discharge status: Home / Self Care | Payer: No Typology Code available for payment source | Source: Ambulatory Visit | Attending: Obstetrics and Gynecology | Admitting: Obstetrics and Gynecology

## 2014-10-10 DIAGNOSIS — N6320 Unspecified lump in the left breast, unspecified quadrant: Secondary | ICD-10-CM

## 2015-02-19 ENCOUNTER — Encounter: Payer: Self-pay | Admitting: Internal Medicine

## 2015-02-19 ENCOUNTER — Ambulatory Visit: Payer: Self-pay | Attending: Internal Medicine | Admitting: Internal Medicine

## 2015-02-19 VITALS — BP 144/85 | HR 98 | Temp 98.0°F | Resp 16 | Ht 62.0 in | Wt 184.6 lb

## 2015-02-19 DIAGNOSIS — Z7984 Long term (current) use of oral hypoglycemic drugs: Secondary | ICD-10-CM | POA: Insufficient documentation

## 2015-02-19 DIAGNOSIS — G44209 Tension-type headache, unspecified, not intractable: Secondary | ICD-10-CM

## 2015-02-19 DIAGNOSIS — N949 Unspecified condition associated with female genital organs and menstrual cycle: Secondary | ICD-10-CM

## 2015-02-19 DIAGNOSIS — N898 Other specified noninflammatory disorders of vagina: Secondary | ICD-10-CM | POA: Insufficient documentation

## 2015-02-19 DIAGNOSIS — I10 Essential (primary) hypertension: Secondary | ICD-10-CM | POA: Insufficient documentation

## 2015-02-19 DIAGNOSIS — Z23 Encounter for immunization: Secondary | ICD-10-CM | POA: Insufficient documentation

## 2015-02-19 DIAGNOSIS — E119 Type 2 diabetes mellitus without complications: Secondary | ICD-10-CM | POA: Insufficient documentation

## 2015-02-19 DIAGNOSIS — R51 Headache: Secondary | ICD-10-CM | POA: Insufficient documentation

## 2015-02-19 LAB — POCT GLYCOSYLATED HEMOGLOBIN (HGB A1C): Hemoglobin A1C: 6.8

## 2015-02-19 LAB — POCT URINALYSIS DIPSTICK
Bilirubin, UA: NEGATIVE
GLUCOSE UA: NEGATIVE
Ketones, UA: NEGATIVE
Leukocytes, UA: NEGATIVE
NITRITE UA: NEGATIVE
PH UA: 6
UROBILINOGEN UA: 0.2

## 2015-02-19 LAB — GLUCOSE, POCT (MANUAL RESULT ENTRY): POC Glucose: 140 mg/dl — AB (ref 70–99)

## 2015-02-19 MED ORDER — LOSARTAN POTASSIUM 50 MG PO TABS: 50.0000 mg | ORAL_TABLET | Freq: Every day | ORAL | Status: DC

## 2015-02-19 MED ORDER — MICONAZOLE NITRATE 100 MG VA SUPP
100.0000 mg | Freq: Every day | VAGINAL | Status: DC
Start: 2015-02-19 — End: 2016-01-06

## 2015-02-19 MED ORDER — CYCLOBENZAPRINE HCL 5 MG PO TABS: 5.0000 mg | ORAL_TABLET | Freq: Three times a day (TID) | ORAL | Status: DC | PRN

## 2015-02-19 MED ORDER — METFORMIN HCL ER 500 MG PO TB24: 500.0000 mg | ORAL_TABLET | Freq: Every day | ORAL | Status: DC

## 2015-02-19 NOTE — Progress Notes (Signed)
Patient ID: Jennifer Dunn, female   DOB: 12/23/1975, 39 y.o.   MRN: [id number] SUBJECTIVE: 39 y.o. female for follow up of diabetes and hypertension. Patient reports that she has been out of her losartan for 2 weeks.  She states that she normally takes her Losartan as scheduled but has not had a ride to the pharmacy. She does not check her pressures at home. She reports mild headaches that begin her neck and radiate up to her occipital region. She denies associated nausea, vomiting, photophobia. She does admit to increased life stressors.  Diabetic Review of Systems - medication compliance: compliant all of the time, diabetic diet compliance: compliant all of the time, further diabetic ROS: no polyuria or polydipsia, no chest pain, dyspnea or TIA's, no numbness, tingling or pain in extremities.  Other symptoms and concerns: She reports that she has been experiencing vaginal itching and burning. Has some thick white vaginal discharge. No dysuria or new sexual partners.  Current Outpatient Prescriptions  Medication Sig Dispense Refill  . losartan (COZAAR) 50 MG tablet Take 1 tablet (50 mg total) by mouth daily. 30 tablet 3  . metFORMIN (GLUCOPHAGE XR) 500 MG 24 hr tablet Take 1 tablet (500 mg total) by mouth daily with breakfast. 30 tablet 5  . glucose blood test strip Use as instructed 100 each 12  . glucose monitoring kit (FREESTYLE) monitoring kit 1 each by Does not apply route as needed. 1 each 0  . ibuprofen (ADVIL,MOTRIN) 600 MG tablet Take 1 tablet (600 mg total) by mouth every 6 (six) hours as needed. (Patient not taking: Reported on 07/14/2014) 30 tablet 0  . Lancets (FREESTYLE) lancets Use as instructed 100 each 12  . Multiple Vitamin (MULTIVITAMIN) tablet Take 1 tablet by mouth daily.    Marland Kitchen nystatin (MYCOSTATIN/NYSTOP) 100000 UNIT/GM POWD May use under arms/breast twice daily 60 g 1  . terbinafine (LAMISIL) 250 MG tablet Take 1 tablet (250 mg total) by mouth daily. 30 tablet 1  .  traMADol (ULTRAM) 50 MG tablet Take 1 tablet (50 mg total) by mouth every 6 (six) hours as needed. (Patient not taking: Reported on 07/14/2014) 15 tablet 0   No current facility-administered medications for this visit.   Review of Systems Other than what is stated in HPI, all other systems are negative.    OBJECTIVE: Appearance: alert, well appearing, and in no distress, oriented to person, place, and time and overweight. BP 144/85 mmHg  Pulse 98  Temp(Src) 98 F (36.7 C)  Resp 16  Ht _0  (1.575 m)  Wt 184 lb 9.6 oz (83.734 kg)  BMI 33.76 kg/m2  SpO2 100%  Exam: heart sounds normal rate, regular rhythm, normal S1, S2, no murmurs, rubs, clicks or gallops, no JVD, chest clear, no hepatosplenomegaly, no carotid bruits, feet: no trophic changes or ulcerative lesions. No edema  ASSESSMENT: Abelina was seen today for follow-up.  Diagnoses and all orders for this visit:  Type 2 diabetes mellitus without complication, without long-term current use of insulin (HCC) -     Glucose (CBG) -     HgB A1c -     Flu Vaccine QUAD 36+ mos PF IM (Fluarix & Fluzone Quad PF) -     metFORMIN (GLUCOPHAGE XR) 500 MG 24 hr tablet; Take 1 tablet (500 mg total) by mouth daily with breakfast. Patients diabetes is well control as evidence by consistently low a1c.  Patient will continue with current therapy and continue to make necessary lifestyle changes.  Reviewed foot care,  diet, exercise, annual health maintenance with patient.   Essential hypertension -     losartan (COZAAR) 50 MG tablet; Take 1 tablet (50 mg total) by mouth daily. Patient's BP is elevated today due to being out of medication. I will continue to monitor pressure on repeat exam.   Vaginal burning -     miconazole (MICOTIN) 100 MG vaginal suppository; Place 1 suppository (100 mg total) vaginally at bedtime. -     POCT urinalysis dipstick Symptoms consistent with vaginal candidiasis, will treat as such.  Tension-type headache, not  intractable, unspecified chronicity pattern -     cyclobenzaprine (FLEXERIL) 5 MG tablet; Take 1 tablet (5 mg total) by mouth 3 (three) times daily as needed for muscle spasms. Discussed ways to better handle stress. She may try low dose flexeril to help with neck tension and headaches.  Due to language barrier, an interpreter was present during the history-taking and subsequent discussion (and for part of the physical exam) with this patient.  Return in about 3 months (around 05/22/2015) for DM/HTN.  Lance Bosch, NP 02/19/2015 6:18 PM

## 2015-02-19 NOTE — Progress Notes (Signed)
Patient here for follow up on her HTN and diabetes Patient has been out of her blood pressure medication for about a week

## 2015-02-19 NOTE — Patient Instructions (Signed)
Monistat anti-itch cream for your husband

## 2015-05-28 ENCOUNTER — Encounter (HOSPITAL_COMMUNITY): Payer: Self-pay | Admitting: Emergency Medicine

## 2015-05-28 ENCOUNTER — Emergency Department (HOSPITAL_COMMUNITY)
Admission: EM | Admit: 2015-05-28 | Discharge: 2015-05-28 | Disposition: A | Discharge status: Home / Self Care | Payer: No Typology Code available for payment source | Attending: Emergency Medicine | Admitting: Emergency Medicine

## 2015-05-28 DIAGNOSIS — I10 Essential (primary) hypertension: Secondary | ICD-10-CM | POA: Insufficient documentation

## 2015-05-28 DIAGNOSIS — Z7984 Long term (current) use of oral hypoglycemic drugs: Secondary | ICD-10-CM | POA: Insufficient documentation

## 2015-05-28 DIAGNOSIS — Y9289 Other specified places as the place of occurrence of the external cause: Secondary | ICD-10-CM | POA: Insufficient documentation

## 2015-05-28 DIAGNOSIS — S0001XA Abrasion of scalp, initial encounter: Secondary | ICD-10-CM

## 2015-05-28 DIAGNOSIS — W2209XA Striking against other stationary object, initial encounter: Secondary | ICD-10-CM | POA: Insufficient documentation

## 2015-05-28 DIAGNOSIS — Y999 Unspecified external cause status: Secondary | ICD-10-CM | POA: Insufficient documentation

## 2015-05-28 DIAGNOSIS — Z79899 Other long term (current) drug therapy: Secondary | ICD-10-CM | POA: Insufficient documentation

## 2015-05-28 DIAGNOSIS — S0990XA Unspecified injury of head, initial encounter: Secondary | ICD-10-CM

## 2015-05-28 DIAGNOSIS — E119 Type 2 diabetes mellitus without complications: Secondary | ICD-10-CM | POA: Insufficient documentation

## 2015-05-28 DIAGNOSIS — Y9389 Activity, other specified: Secondary | ICD-10-CM | POA: Insufficient documentation

## 2015-05-28 HISTORY — DX: Type 2 diabetes mellitus without complications: E11.9

## 2015-05-28 MED ORDER — IBUPROFEN 400 MG PO TABS: 800.0000 mg | ORAL_TABLET | Freq: Once | ORAL | Status: DC

## 2015-05-28 MED FILL — LOSARTAN POTASSIUM 50 MG TA: 50 | 30 days supply | Qty: 30 | Fill #2

## 2015-05-28 MED FILL — METFORMIN HCL ER 500 MG TAB: 500 | 30 days supply | Qty: 30 | Fill #2

## 2015-05-28 NOTE — ED Notes (Signed)
See PA assessment 

## 2015-05-28 NOTE — Discharge Instructions (Signed)
Para el control del dolor por favor tome ibuprofeno (tambin conocido como Motrin o Advil) 800 mg (esto es normalmente 4 [place] Scientist, physiological) 3 veces [place]. Tome con alimentos para minimizar la irritacin del [place].  Aplique hielo a la zona que est hinchada y dolorosa en la cabeza.  Lave el rea afectada con agua y [place] y aplique una fina capa de ungento antibitico tpico. Haga esto cada 12 horas.  No use alcohol o perxido de hidrgeno.  Busque signos de infeccin: si usted ve enrojecimiento, si el rea se calienta, si el dolor aumenta bruscamente, hay secrecin (pus), si aparecen rayas rojas o si tiene fiebre o vmito, VUELVA inmediatamente al [employer] para una revisin.  Por favor, siga con su mdico de atencin primaria en los prximos 2 das para un chequeo. Deben obtener registros para su posterior manejo.  No dude en volver al [facility] para cualquier nuevo, empeoramiento o sntomas relacionados.  For pain control please take ibuprofen (also known as Motrin or Advil) 800mg  (this is normally 4 over the counter pills) 3 times a day  for 5 days. Take with food to minimize stomach irritation.  Apply ice to the area that is swollen and painful on your head.  Wash the affected area with soap and water and apply a thin layer of topical antibiotic ointment. Do this every 12 hours.   Do not use rubbing alcohol or hydrogen peroxide.                        Look for signs of infection: if you see redness, if the area becomes warm, if pain increases sharply, there is discharge (pus), if red streaks appear or you develop fever or vomiting, RETURN immediately to the Emergency Department  for a recheck.   Please follow with your primary care doctor in the next 2 days for a check-up. They must obtain records for further management.    Abrasin (Abrasion) Una abrasin es un corte o una raspadura en la superficie de la piel. La  abrasin no atraviesa todas las capas de la piel. Es importante cuidar bien de la abrasin para prevenir una infeccin. [place] o aplquese los medicamentos solamente como se lo haya indicado el mdico.  Si le recetaron un ungento antibitico, asegrese de terminarlo aunque comience a sentirse mejor. Cuidados de la herida  Limpie la herida con agua y un jabn suave de 2a 3veces al da o como se lo haya indicado el mdico. Seque la herida dando palmaditas con una toalla limpia. No la frote.  Existen [employer] de cerrar y [employer] herida. Siga las indicaciones del mdico respecto de lo siguiente:  Cmo cuidar de la herida.  Cmo y cundo cambiar las vendas (vendaje).  Cundo y cmo retirar el vendaje.  Controle la herida [employer] para detectar signos de infeccin. Est atento a lo siguiente:  Dolor, hinchazn o enrojecimiento.  Lquido, sangre o pus. Instrucciones generales  Mantenga el vendaje seco como se lo haya indicado el mdico. No tome baos de inmersin, no nade, no use el jacuzzi ni haga ninguna actividad en la que la herida quede debajo del agua hasta que el mdico lo autorice.  Si tiene hinchazn, eleve la zona lesionada por encima del nivel del corazn cuando est sentado o acostado.  Concurra a todas las visitas de control como se lo haya indicado el  mdico. Esto es importante. SOLICITE AYUDA SI:  Le aplicaron la antitetnica y en el lugar de la insercin de la aguja tiene alguno de estos signos:  Hinchazn.  Dolor intenso.  Enrojecimiento.  Hemorragia.  Los medicamentos no Forensic psychologist.  Tiene alguno de estos signos en el lugar de la herida:  Aumenta el enrojecimiento.  Aumenta la hinchazn.  Aumenta el dolor. SOLICITE AYUDA DE INMEDIATO SI:  Tiene una lnea roja que sale de la herida.  Tiene fiebre.  Observa lquido, sangre o pus que salen de la herida.  Percibe que sale mal olor de la  herida.   Esta informacin no tiene Marine scientist el consejo del mdico. Asegrese de hacerle al mdico cualquier pregunta que tenga.   Document Released: 12/28/2011 Document Revised: 08/19/2014 Elsevier Interactive Patient Education 2016 [place] not hesitate to return to the Emergency Department for any new, worsening or concerning symptoms.

## 2015-05-28 NOTE — ED Notes (Signed)
Suture cart set up at bedside  

## 2015-05-28 NOTE — ED Notes (Signed)
Pt states a lamp fell and hit her head-- denies LOC, small lac to right parietal area of scalp.

## 2015-05-28 NOTE — ED Provider Notes (Signed)
CSN: [id number]     Arrival date & time 05/28/15  7262 History  By signing my name below, I, Essence Howell, attest that this documentation has been prepared under the direction and in the presence of Wynetta Emery, PA-C Electronically Signed: Charline Bills, ED Scribe 05/28/2015 at 9:26 AM.   Chief Complaint  Patient presents with  . Head Injury   The history is provided by the patient. The history is limited by a language barrier. A language interpreter was used.   HPI Comments: Jennifer Dunn is a 40 y.o. female, with a h/o HTN and DM, who presents to the Emergency Department complaining of a head injury sustained earlier this morning. Pt states that she pulled a drawer open when a 5 lb lamp fell onto her head. She applied Neosporin to the wound in her scalp sustained from the lamp. No medications tried PTA. Pt's last tetanus was 2 years ago. She denies syncope and anticoagulant use.   Past Medical History  Diagnosis Date  . Hypertension    No past surgical history on file. No family history on file. Social History  Substance Use Topics  . Smoking status: Never Smoker   . Smokeless tobacco: Not on file  . Alcohol Use: No   OB History    No data available     Review of Systems  A complete 10 system review of systems was obtained and all systems are negative except as noted in the HPI and PMH.   Allergies  Review of patient's allergies indicates no known allergies.  Home Medications   Prior to Admission medications   Medication Sig Start Date End Date Taking? Authorizing Provider  cyclobenzaprine (FLEXERIL) 5 MG tablet Take 1 tablet (5 mg total) by mouth 3 (three) times daily as needed for muscle spasms. 02/19/15   Ambrose Finland, NP  glucose blood test strip Use as instructed 08/14/14   Ambrose Finland, NP  glucose monitoring kit (FREESTYLE) monitoring kit 1 each by Does not apply route as needed. 08/14/14   Ambrose Finland, NP  ibuprofen (ADVIL,MOTRIN) 600 MG tablet Take  1 tablet (600 mg total) by mouth every 6 (six) hours as needed. Patient not taking: Reported on 07/14/2014 10/24/13   Brandt Loosen, MD  Lancets (FREESTYLE) lancets Use as instructed 08/14/14   Ambrose Finland, NP  losartan (COZAAR) 50 MG tablet Take 1 tablet (50 mg total) by mouth daily. 02/19/15   Ambrose Finland, NP  metFORMIN (GLUCOPHAGE XR) 500 MG 24 hr tablet Take 1 tablet (500 mg total) by mouth daily with breakfast. 02/19/15   Ambrose Finland, NP  miconazole (MICOTIN) 100 MG vaginal suppository Place 1 suppository (100 mg total) vaginally at bedtime. 02/19/15   Ambrose Finland, NP  Multiple Vitamin (MULTIVITAMIN) tablet Take 1 tablet by mouth daily.    Historical Provider, MD  nystatin (MYCOSTATIN/NYSTOP) 100000 UNIT/GM POWD May use under arms/breast twice daily 08/14/14   Ambrose Finland, NP  traMADol (ULTRAM) 50 MG tablet Take 1 tablet (50 mg total) by mouth every 6 (six) hours as needed. Patient not taking: Reported on 07/14/2014 10/24/13   Brandt Loosen, MD   BP 149/108 mmHg  Pulse 68  Temp(Src) 97.6 F (36.4 C) (Oral)  Resp 18  SpO2 98% Physical Exam  Constitutional: She is oriented to person, place, and time. She appears well-developed and well-nourished.  HENT:  Head: Normocephalic. Head is with abrasion.  Right Ear: External ear normal.  Left Ear: External ear normal.  1 cm abrasion on the R parietal.   Eyes: Conjunctivae are normal.  Neck: No tracheal deviation present.  Pulmonary/Chest: Effort normal. No respiratory distress.  Abdominal: She exhibits no distension.  Musculoskeletal: Normal range of motion.  Neurological: She is alert and oriented to person, place, and time.  Follows commands, Clear, goal oriented speech, Strength is 5 out of 5x4 extremities, patient ambulates with a coordinated in nonantalgic gait. Sensation is grossly intact.   Skin: Skin is warm and dry.  Psychiatric: She has a normal mood and affect. Her behavior is normal.  Nursing note and vitals reviewed.  ED  Course  Procedures (including critical care time) DIAGNOSTIC STUDIES: Oxygen Saturation is 98% on RA, noraml by my interpretation.    COORDINATION OF CARE: 9:24 AM-Discussed treatment plan which includes ibuprofen with pt at bedside and pt agreed to plan.   Labs Review Labs Reviewed - No data to display  Imaging Review No results found.   EKG Interpretation None      MDM   Final diagnoses:  Head trauma, initial encounter  Scalp abrasion, initial encounter    Filed Vitals:   05/28/15 0903  BP: 149/108  Pulse: 68  Temp: 97.6 F (36.4 C)  TempSrc: Oral  Resp: 18  SpO2: 98%    Medications  ibuprofen (ADVIL,MOTRIN) tablet 800 mg (800 mg Oral Not Given 05/28/15 0933)    Jennifer Dunn is 40 y.o. female presenting with mild head trauma. A lamp fell on her head just prior to arrival she has a small abrasion. Tetanus is up-to-date. Neuro exam is nonfocal, no indication for neuroimaging. Patient given ibuprofen and counseled on wound care and return precautions.  Evaluation does not show pathology that would require ongoing emergent intervention or inpatient treatment. Pt is hemodynamically stable and mentating appropriately. Discussed findings and plan with patient/guardian, who agrees with care plan. All questions answered. Return precautions discussed and outpatient follow up given.    I personally performed the services described in this documentation, which was scribed in my presence. The recorded information has been reviewed and is accurate.    Monico Blitz, PA-C 05/28/15 1254  Charlesetta Shanks, MD 05/29/15 [phone]

## 2015-05-28 NOTE — ED Notes (Addendum)
Patient here with head pain after lamp fell on same this am, no nausea, no associated complain ts other than pain. Alert and oriented. Small laceration noted

## 2015-07-27 MED FILL — METFORMIN HCL ER 500 MG TAB: 500 | 30 days supply | Qty: 30 | Fill #3

## 2015-07-27 MED FILL — LOSARTAN POTASSIUM 50 MG TA: 50 | 30 days supply | Qty: 30 | Fill #3

## 2015-09-22 MED FILL — METFORMIN HCL ER 500 MG TAB: 500 | 30 days supply | Qty: 30 | Fill #4

## 2015-09-22 MED FILL — LOSARTAN POTASSIUM 50 MG TA: 50 | 30 days supply | Qty: 30 | Fill #4

## 2015-10-13 ENCOUNTER — Other Ambulatory Visit: Payer: Self-pay | Admitting: Internal Medicine

## 2015-10-13 DIAGNOSIS — Z1231 Encounter for screening mammogram for malignant neoplasm of breast: Secondary | ICD-10-CM

## 2015-10-21 ENCOUNTER — Ambulatory Visit
Admission: RE | Admit: 2015-10-21 | Discharge: 2015-10-21 | Disposition: A | Discharge status: Home / Self Care | Payer: No Typology Code available for payment source | Source: Ambulatory Visit | Attending: Internal Medicine | Admitting: Internal Medicine

## 2015-10-21 DIAGNOSIS — Z1231 Encounter for screening mammogram for malignant neoplasm of breast: Secondary | ICD-10-CM

## 2015-12-29 MED FILL — ?LOSARTAN POTASSIUM 50 MG T: 50 | 30 days supply | Qty: 30 | Fill #5

## 2015-12-29 MED FILL — METFORMIN HCL ER 500 MG TAB: 500 | 30 days supply | Qty: 30 | Fill #5

## 2016-01-06 ENCOUNTER — Encounter: Payer: Self-pay | Admitting: Family Medicine

## 2016-01-06 ENCOUNTER — Ambulatory Visit: Payer: Self-pay | Attending: Family Medicine | Admitting: Family Medicine

## 2016-01-06 VITALS — BP 174/83 | HR 94 | Temp 98.3°F | Wt 184.2 lb

## 2016-01-06 DIAGNOSIS — R51 Headache: Secondary | ICD-10-CM

## 2016-01-06 DIAGNOSIS — I152 Hypertension secondary to endocrine disorders: Secondary | ICD-10-CM | POA: Insufficient documentation

## 2016-01-06 DIAGNOSIS — Z23 Encounter for immunization: Secondary | ICD-10-CM

## 2016-01-06 DIAGNOSIS — I10 Essential (primary) hypertension: Secondary | ICD-10-CM

## 2016-01-06 DIAGNOSIS — E119 Type 2 diabetes mellitus without complications: Secondary | ICD-10-CM | POA: Insufficient documentation

## 2016-01-06 DIAGNOSIS — M62838 Other muscle spasm: Secondary | ICD-10-CM

## 2016-01-06 DIAGNOSIS — R519 Headache, unspecified: Secondary | ICD-10-CM

## 2016-01-06 LAB — GLUCOSE, POCT (MANUAL RESULT ENTRY): POC GLUCOSE: 87 mg/dL (ref 70–99)

## 2016-01-06 LAB — POCT GLYCOSYLATED HEMOGLOBIN (HGB A1C): Hemoglobin A1C: 7.1

## 2016-01-06 MED ORDER — LOSARTAN POTASSIUM 50 MG PO TABS: 50.0000 mg | ORAL_TABLET | Freq: Every day | ORAL | 5 refills | Status: DC

## 2016-01-06 MED ORDER — METFORMIN HCL ER 500 MG PO TB24: 500.0000 mg | ORAL_TABLET | Freq: Every day | ORAL | 5 refills | Status: DC

## 2016-01-06 MED ORDER — CYCLOBENZAPRINE HCL 5 MG PO TABS: 5.0000 mg | ORAL_TABLET | Freq: Three times a day (TID) | ORAL | 1 refills | Status: DC | PRN

## 2016-01-06 MED FILL — CYCLOBENZAPRINE 5 MG TABLET: 5 | 10 days supply | Qty: 30 | Fill #0

## 2016-01-06 NOTE — Progress Notes (Signed)
Subjective:  Patient ID: Jennifer Dunn, female    DOB: 15-Mar-1976  Age: 40 y.o. MRN: [id number]  CC: Establish Care (Re est Care) and Gynecologic Exam (PAP)   HPI Jennifer Dunn comes in to establish care with me as she was previously followed by the nurse practitioner.  Medical history significant for type 2 diabetes mellitus (A1c 7.1), hypertension and she endorses being out of her medications for the last few weeks. She has not had blood work since 08/2014. Not up-to-date on eye exam and does not exercise.  Complains of occasional posterior headaches which she thinks is related to her losartan however in the last few days when she was out of her antihypertensives she still had the headache. Denies blurry vision, nausea or vomiting. Takes Flexeril for occasional muscle spasms in her neck and upper back.  Past Medical History:  Diagnosis Date  . Diabetes mellitus without complication ([place])   . Hypertension     No past surgical history on file.  No Known Allergies   Outpatient Medications Prior to Visit  Medication Sig Dispense Refill  . glucose blood test strip Use as instructed 100 each 12  . glucose monitoring kit (FREESTYLE) monitoring kit 1 each by Does not apply route as needed. 1 each 0  . Lancets (FREESTYLE) lancets Use as instructed 100 each 12  . Multiple Vitamin (MULTIVITAMIN) tablet Take 1 tablet by mouth daily.    . traMADol (ULTRAM) 50 MG tablet Take 1 tablet (50 mg total) by mouth every 6 (six) hours as needed. (Patient not taking: Reported on 01/06/2016) 15 tablet 0  . cyclobenzaprine (FLEXERIL) 5 MG tablet Take 1 tablet (5 mg total) by mouth 3 (three) times daily as needed for muscle spasms. (Patient not taking: Reported on 01/06/2016) 30 tablet 1  . ibuprofen (ADVIL,MOTRIN) 600 MG tablet Take 1 tablet (600 mg total) by mouth every 6 (six) hours as needed. (Patient not taking: Reported on 01/06/2016) 30 tablet 0  . losartan (COZAAR) 50 MG tablet  Take 1 tablet (50 mg total) by mouth daily. (Patient not taking: Reported on 01/06/2016) 30 tablet 5  . metFORMIN (GLUCOPHAGE XR) 500 MG 24 hr tablet Take 1 tablet (500 mg total) by mouth daily with breakfast. (Patient not taking: Reported on 01/06/2016) 30 tablet 5  . miconazole (MICOTIN) 100 MG vaginal suppository Place 1 suppository (100 mg total) vaginally at bedtime. (Patient not taking: Reported on 01/06/2016) 7 suppository 0  . nystatin (MYCOSTATIN/NYSTOP) 100000 UNIT/GM POWD May use under arms/breast twice daily (Patient not taking: Reported on 01/06/2016) 60 g 1   No facility-administered medications prior to visit.     ROS Review of Systems  Constitutional: Negative for activity change, appetite change and fatigue.  HENT: Negative for congestion, sinus pressure and sore throat.   Eyes: Negative for visual disturbance.  Respiratory: Negative for cough, chest tightness, shortness of breath and wheezing.   Cardiovascular: Negative for chest pain and palpitations.  Gastrointestinal: Negative for abdominal distention, abdominal pain and constipation.  Endocrine: Negative for polydipsia.  Genitourinary: Negative for dysuria and frequency.  Musculoskeletal: Negative for arthralgias and back pain.  Skin: Negative for rash.  Neurological: Positive for headaches. Negative for tremors, light-headedness and numbness.  Hematological: Does not bruise/bleed easily.  Psychiatric/Behavioral: Negative for agitation and behavioral problems.    Objective:  BP (!) 174/83 (BP Location: Left Arm, Patient Position: Sitting, Cuff Size: Large)   Pulse 94   Temp 98.3 F (36.8 C) (Oral)   Wt  184 lb 3.2 oz (83.6 kg)   LMP 09/24/2015 (Approximate) Comment: Irregular periods  BMI 33.69 kg/m   BP/Weight 01/06/2016 05/28/2015 83/06/8248  Systolic BP 539 767 341  Diastolic BP 83 937 85  Wt. (Lbs) 184.2 - 184.6  BMI 33.69 - 33.76      Physical Exam  Constitutional: She is oriented to person, place, and  time. She appears well-developed and well-nourished.  Cardiovascular: Normal rate, normal heart sounds and intact distal pulses.   No murmur heard. Pulmonary/Chest: Effort normal and breath sounds normal. She has no wheezes. She has no rales. She exhibits no tenderness.  Abdominal: Soft. Bowel sounds are normal. She exhibits no distension and no mass. There is no tenderness.  Musculoskeletal: Normal range of motion.  Neurological: She is alert and oriented to person, place, and time.     Assessment & Plan:   1. Type 2 diabetes mellitus without complication, without long-term current use of insulin (HCC) Controlled with A1c of 7.1 Compliance emphasized Discussed the need for annual eye exam-she has been provided with community resources for this as she has no medical coverage - POCT glucose (manual entry) - POCT glycosylated hemoglobin (Hb A1C) - metFORMIN (GLUCOPHAGE XR) 500 MG 24 hr tablet; Take 1 tablet (500 mg total) by mouth daily with breakfast.  Dispense: 30 tablet; Refill: 5 - Microalbumin / creatinine urine ratio; Future  2. Essential hypertension Uncontrolled due to running out of medications We'll reassess blood pressure at next visit Low-sodium, DASH diet - losartan (COZAAR) 50 MG tablet; Take 1 tablet (50 mg total) by mouth daily.  Dispense: 30 tablet; Refill: 5 - COMPLETE METABOLIC PANEL WITH GFR; Future - Lipid panel; Future  3. Muscle spasm Controlled on Flexeril  4. Chronic nonintractable headache, unspecified headache type Could be secondary to uncontrolled blood pressure We'll reassess at next visit once Blood pressure has been optimized  5. Encounter for immunization - Flu Vaccine QUAD 36+ mos IM   Meds ordered this encounter  Medications  . losartan (COZAAR) 50 MG tablet    Sig: Take 1 tablet (50 mg total) by mouth daily.    Dispense:  30 tablet    Refill:  5  . metFORMIN (GLUCOPHAGE XR) 500 MG 24 hr tablet    Sig: Take 1 tablet (500 mg total) by  mouth daily with breakfast.    Dispense:  30 tablet    Refill:  5  . cyclobenzaprine (FLEXERIL) 5 MG tablet    Sig: Take 1 tablet (5 mg total) by mouth 3 (three) times daily as needed for muscle spasms.    Dispense:  30 tablet    Refill:  1    Follow-up: Return in about 2 weeks (around 01/20/2016) for complete physical exam.   This note has been created with Surveyor, quantity. Any transcriptional errors are unintentional.    Arnoldo Morale MD

## 2016-01-06 NOTE — Progress Notes (Signed)
Pt states she is here today for the following:  Re establish Care Pap Smear Medication refill  Pt states she has been without her diabetes and hypertension medication for three weeks  due to not being able to schedule an appointment.

## 2016-01-07 ENCOUNTER — Ambulatory Visit: Payer: Self-pay | Attending: Family Medicine

## 2016-01-07 DIAGNOSIS — E119 Type 2 diabetes mellitus without complications: Secondary | ICD-10-CM | POA: Insufficient documentation

## 2016-01-07 DIAGNOSIS — I10 Essential (primary) hypertension: Secondary | ICD-10-CM | POA: Insufficient documentation

## 2016-01-07 LAB — LIPID PANEL
Cholesterol: 156 mg/dL (ref 125–200)
HDL: 53 mg/dL (ref >=46)
LDL CALC: 70 mg/dL (ref <130)
TRIGLYCERIDES: 166 mg/dL — AB (ref <150)
Total CHOL/HDL Ratio: 2.9 Ratio (ref <=5.0)
VLDL: 33 mg/dL — ABNORMAL HIGH (ref <30)

## 2016-01-07 LAB — COMPLETE METABOLIC PANEL WITH GFR
ALBUMIN: 3.5 g/dL — AB (ref 3.6–5.1)
ALK PHOS: 77 U/L (ref 33–115)
ALT: 40 U/L — AB (ref 6–29)
AST: 39 U/L — ABNORMAL HIGH (ref 10–30)
BILIRUBIN TOTAL: 0.2 mg/dL (ref 0.2–1.2)
BUN: 19 mg/dL (ref 7–25)
CO2: 27 mmol/L (ref 20–31)
CREATININE: 1.15 mg/dL — AB (ref 0.50–1.10)
Calcium: 8.5 mg/dL — ABNORMAL LOW (ref 8.6–10.2)
Chloride: 105 mmol/L (ref 98–110)
GFR, EST AFRICAN AMERICAN: 68 mL/min (ref >=60)
GFR, EST NON AFRICAN AMERICAN: 59 mL/min — AB (ref >=60)
Glucose, Bld: 112 mg/dL — ABNORMAL HIGH (ref 65–99)
Potassium: 4.4 mmol/L (ref 3.5–5.3)
Sodium: 139 mmol/L (ref 135–146)
TOTAL PROTEIN: 6.8 g/dL (ref 6.1–8.1)

## 2016-01-07 NOTE — Progress Notes (Signed)
Patient here for lab work only 

## 2016-01-08 LAB — MICROALBUMIN / CREATININE URINE RATIO
CREATININE, URINE: 81 mg/dL (ref 20–320)
Microalb Creat Ratio: 864 mcg/mg creat — ABNORMAL HIGH (ref <30)
Microalb, Ur: 70 mg/dL

## 2016-01-11 ENCOUNTER — Other Ambulatory Visit: Payer: Self-pay | Admitting: Family Medicine

## 2016-01-11 MED ORDER — ATORVASTATIN CALCIUM 20 MG PO TABS: 20.0000 mg | ORAL_TABLET | Freq: Every day | ORAL | 3 refills | Status: DC

## 2016-01-11 MED FILL — ATORVASTATIN 20 MG TABLET: 20 | 30 days supply | Qty: 30 | Fill #0

## 2016-01-13 ENCOUNTER — Telehealth: Payer: Self-pay

## 2016-01-13 NOTE — Telephone Encounter (Signed)
-----   Message from Arnoldo Morale, MD sent at 01/11/2016  2:35 PM EDT ----- Labs revealed microalbuminuria which could be secondary to diabetes; cholesterol is normal but due to her increased cardiovascular risk from diabetes I have placed her on a low-dose statin.

## 2016-01-13 NOTE — Telephone Encounter (Signed)
Writer called patient through [employer] regarding her blood work.  Patient stated understanding and will pick up prescribed medication from Dr. Jarold Song.

## 2016-02-03 MED FILL — LOSARTAN POTASSIUM 50 MG TA: 50 | 30 days supply | Qty: 30 | Fill #0

## 2016-02-03 MED FILL — ATORVASTATIN 20 MG TABLET: 20 | 30 days supply | Qty: 30 | Fill #1

## 2016-02-03 MED FILL — METFORMIN HCL ER 500 MG TAB: 500 | 30 days supply | Qty: 30 | Fill #0

## 2016-02-16 ENCOUNTER — Encounter: Payer: Self-pay | Admitting: Family Medicine

## 2016-02-16 ENCOUNTER — Ambulatory Visit: Payer: Self-pay | Attending: Family Medicine | Admitting: Family Medicine

## 2016-02-16 VITALS — BP 140/94 | HR 87 | Temp 98.1°F | Resp 20 | Wt 184.0 lb

## 2016-02-16 DIAGNOSIS — Z1239 Encounter for other screening for malignant neoplasm of breast: Secondary | ICD-10-CM

## 2016-02-16 DIAGNOSIS — N926 Irregular menstruation, unspecified: Secondary | ICD-10-CM | POA: Insufficient documentation

## 2016-02-16 DIAGNOSIS — Z79899 Other long term (current) drug therapy: Secondary | ICD-10-CM | POA: Insufficient documentation

## 2016-02-16 DIAGNOSIS — Z124 Encounter for screening for malignant neoplasm of cervix: Secondary | ICD-10-CM

## 2016-02-16 DIAGNOSIS — Z1231 Encounter for screening mammogram for malignant neoplasm of breast: Secondary | ICD-10-CM

## 2016-02-16 DIAGNOSIS — Z23 Encounter for immunization: Secondary | ICD-10-CM

## 2016-02-16 DIAGNOSIS — I10 Essential (primary) hypertension: Secondary | ICD-10-CM | POA: Insufficient documentation

## 2016-02-16 DIAGNOSIS — G44229 Chronic tension-type headache, not intractable: Secondary | ICD-10-CM | POA: Insufficient documentation

## 2016-02-16 DIAGNOSIS — E119 Type 2 diabetes mellitus without complications: Secondary | ICD-10-CM | POA: Insufficient documentation

## 2016-02-16 DIAGNOSIS — Z7984 Long term (current) use of oral hypoglycemic drugs: Secondary | ICD-10-CM | POA: Insufficient documentation

## 2016-02-16 LAB — POCT GLYCOSYLATED HEMOGLOBIN (HGB A1C): HEMOGLOBIN A1C: 6.5

## 2016-02-16 LAB — GLUCOSE, POCT (MANUAL RESULT ENTRY): POC Glucose: 111 mg/dl — AB (ref 70–99)

## 2016-02-16 MED ORDER — BUTALBITAL-APAP-CAFFEINE 50-325-40 MG PO TABS: 1.0000 | ORAL_TABLET | Freq: Four times a day (QID) | ORAL | 1 refills | Status: AC | PRN

## 2016-02-16 NOTE — Patient Instructions (Signed)
Jennifer Dunn (Health Maintenance, Female) Un estilo de vida saludable y los cuidados preventivos pueden favorecer considerablemente a la salud y Musician. Pregunte a su mdico cul es el cronograma de exmenes peridicos apropiado para usted. Esta es una buena oportunidad para consultarlo sobre cmo prevenir enfermedades y Jennifer Dunn sano. Adems de los controles, hay muchas otras cosas que puede hacer usted mismo. Los expertos han realizado numerosas investigaciones Jennifer Dunn cambios en el estilo de vida y las medidas de prevencin que, Jennifer Dunn, lo ayudarn a mantenerse sano. Solicite a su mdico ms informacin. EL PESO Y LA DIETA  Consuma una dieta saludable.  Asegrese de [employer] verduras, frutas, productos lcteos de bajo contenido de [place] y Advertising account planner.  No consuma muchos alimentos de alto contenido de grasas slidas, azcares agregados o sal.  Realice actividad fsica con regularidad. Esta es una de las prcticas ms importantes que puede hacer por su salud.  La mayora de los adultos deben hacer ejercicio durante al menos 173mnutos por semana. [name] ejercicio debe aumentar la frecuencia cardaca y pActorla transpiracin (ejercicio de iEnola.  La mayora de los adultos tambin deben hField seismologistejercicios de elongacin al mToysRusveces a la semana. Agregue esto al su plan de ejercicio de intensidad moderada. Mantenga un peso saludable.  [name] ndice de masa corporal ([facility] es una medida que puede utilizarse para identificar posibles problemas de pBelle Valley Proporciona una estimacin de la grasa corporal basndose en el peso y la altura. Su mdico puede ayudarle a dRadiation protection practitionerIAvenely a lScientist, forensico mTheatre managerun peso saludable.  Para las mujeres de 20aos o ms:  Un IMadera Ambulatory Endoscopy Centermenor de 18,5 se considera bajo peso.  Un IAbbeville Area Medical Centerentre 18,5 y 24,9 es normal.  Un IMethodist Hospitalentre 25 y 29,9 se considera sobrepeso.  Un IMC de 30 o ms se considera  obesidad. Observe los niveles de colesterol y lpidos en la sangre.  Debe comenzar a rEnglish as a second language teacherde lpidos y cResearch officer, trade unionen la sangre a los 20aos y luego repetirlos cada 513aos  Es posible que nAutomotive engineerlos niveles de colesterol con mayor frecuencia si:  Sus niveles de lpidos y colesterol son altos.  Es mayor de 50aos.  Presenta un alto riesgo de padecer enfermedades cardacas. DETECCIN DE CNCER  Cncer de pulmn  Se recomienda realizar exmenes de deteccin de cncer de pulmn a personas adultas entre 557y 833aos que estn en riesgo de dHorticulturist, commercialde pulmn por sus antecedentes de consumo de tabaco.  Se recomienda una tomografa computarizada de baja dosis de los pLiberty Mediaaos a las personas que:  Fuman actualmente.  Hayan dejado el hbito en algn momento en los ltimos 15aos.  Hayan fumado durante 30aos un paquete diario. Un paquete-ao equivale a fumar un promedio de un paquete de cigarrillos diario durante un ao.  Los exmenes de deteccin anuales deben continuar hasta que hayan pasado 15aos desde que dej de fumar.  Ya no debern realizarse si tiene un problema de salud que le impida recibir tratamiento para eScience writerde pulmn. Cncer de mama  Practique la autoconciencia de la mama. Esto significa reconocer la apariencia normal de sus mamas y cmo las siente.  Tambin significa realizar autoexmenes regulares de lJohnson & Johnson Informe a su mdico sobre cualquier cambio, sin importar cun pequeo sea.  Si tiene entre 20 y 31aos, un mdico debe realizarle un examen clnico de las mBrunswick Corporationparte del examen regular de sHohenwald cKent[place]  1 a 3aos.  Si tiene 40aos o ms, debe realizarse un examen clnico de las mamas todos los aos. Tambin considere realizarse una radiografa de las mamas (mamografa) todos los aos.  Si tiene antecedentes familiares de cncer de mama, hable con su mdico para someterse a un estudio  gentico.  Si tiene alto riesgo de padecer cncer de mama, hable con su mdico para someterse a una resonancia magntica y una mamografa todos los aos.  La evaluacin del gen del cncer de mama (BRCA) se recomienda a mujeres que tengan familiares con cnceres relacionados con el BRCA. Los cnceres relacionados con el BRCA incluyen los siguientes:  Mama.  Ovario.  Trompas.  Cnceres de peritoneo.  Los resultados de la evaluacin determinarn la necesidad de asesoramiento gentico y de anlisis de BRCA1 y BRCA2. Cncer de cuello del tero [name] mdico puede recomendarle que se haga pruebas peridicas de deteccin de cncer de los rganos de la pelvis (ovarios, tero y vagina). Estas pruebas incluyen un examen plvico, que abarca controlar si se produjeron cambios microscpicos en la superficie del cuello del tero (prueba de Papanicolaou). Pueden recomendarle que se haga estas pruebas cada 3aos, a partir de los 21aos.  A las mujeres que tienen entre 30 y 65aos, los mdicos pueden recomendarles que se sometan a exmenes plvicos y pruebas de Papanicolaou cada 3aos, o a la prueba de Papanicolaou y el examen plvico en combinacin con estudios de deteccin del virus del papiloma humano (VPH) cada 5aos. Algunos tipos de VPH aumentan el riesgo de padecer cncer de cuello del tero. La prueba para la deteccin del VPH tambin puede realizarse a mujeres de cualquier edad cuyos resultados de la prueba de Papanicolaou no sean claros.  Es posible que otros mdicos no recomienden exmenes de deteccin a mujeres no embarazadas que se consideran sujetos de bajo riesgo de padecer cncer de pelvis y que no tienen sntomas. Pregntele al mdico si un examen plvico de deteccin es adecuado para usted.  Si ha recibido un tratamiento para el cncer cervical o una enfermedad que podra causar cncer, necesitar realizarse una prueba de Papanicolaou y controles durante al menos 20 aos de concluido el  tratamiento. Si no se ha hecho el Papanicolaou con regularidad, debern volver a evaluarse los factores de riesgo (como tener un nuevo compaero sexual), para determinar si debe realizarse los estudios nuevamente. Algunas mujeres sufren problemas mdicos que aumentan la probabilidad de contraer cncer de cuello del tero. En estos casos, el mdico podr indicar que se realicen controles y pruebas de Papanicolaou con ms frecuencia. Cncer colorrectal  Este tipo de cncer puede detectarse y a menudo prevenirse.  Por lo general, los estudios de rutina se deben comenzar a hacer a partir de los 50 aos y hasta los 75 aos.  Sin embargo, el mdico podr aconsejarle que lo haga antes, si tiene factores de riesgo para el cncer de colon.  Tambin puede recomendarle que use un kit de prueba para hallar sangre oculta en la materia fecal.  Es posible que se use una pequea cmara en el extremo de un tubo para examinar directamente el colon (sigmoidoscopia o colonoscopia) a fin de detectar formas tempranas de cncer colorrectal.  Los exmenes de rutina generalmente comienzan a los 50aos.  [name] examen directo del colon se debe repetir cada 5 a 10aos hasta los 75aos. Sin embargo, es posible que se realicen exmenes con mayor frecuencia, si se detectan formas tempranas de plipos precancerosos o pequeos bultos. Cncer de piel  Revise   la piel de la cabeza a los pies con regularidad.  Informe a su mdico si aparecen nuevos lunares o los que tiene se modifican, especialmente en su forma y color.  Tambin notifique al mdico si tiene un lunar que es ms grande que el tamao de una goma de lpiz.  Siempre use pantalla solar. Aplique pantalla solar de Kerry Dory y repetida a lo largo del Training and development officer.  Protjase usando mangas y [employer], un sombrero de ala ancha y gafas para el sol, siempre que se encuentre en el exterior. ENFERMEDADES CARDACAS, DIABETES E HIPERTENSIN ARTERIAL   La hipertensin  arterial causa enfermedades cardacas y [place] el riesgo de ictus. La hipertensin arterial es ms probable en los siguientes casos:  Las personas que tienen la presin arterial en el extremo del rango normal (100-139/85-89 mm Hg).  Las personas con sobrepeso u obesidad.  Las Retail banker.  Si usted tiene entre 18 y 39 aos, debe medirse la presin arterial cada 3 a 5 aos. Si usted tiene 40 aos o ms, debe medirse la presin arterial [employer]. Debe medirse la presin arterial dos veces: una vez cuando est en un hospital o una clnica y la otra vez cuando est en otro sitio. Registre el promedio de [employer]. Para controlar su presin arterial cuando no est en un hospital o [place], puede usar lo siguiente:  Ardelia Mems mquina automtica para medir la presin arterial en una farmacia.  Un monitor para medir la presin arterial en el hogar.  Si tiene entre 33 y 105 aos, consulte a su mdico si debe tomar aspirina para prevenir el ictus.  Realcese exmenes de deteccin de la diabetes con regularidad. Esto incluye la toma de [place] de sangre para controlar el nivel de azcar en la sangre durante el [place].  Si tiene un peso normal y un bajo riesgo de padecer diabetes, realcese este anlisis cada tres aos despus de los 45aos.  Si tiene sobrepeso y un alto riesgo de padecer diabetes, considere someterse a este anlisis antes o con mayor frecuencia. PREVENCIN DE INFECCIONES  HepatitisB  Si tiene un riesgo ms alto de Museum/gallery curator hepatitis B, debe someterse a un examen de deteccin de este virus. Se considera que tiene un alto riesgo de Museum/gallery curator hepatitis B si:  Naci en un pas donde la hepatitis B es frecuente. Pregntele a su mdico qu pases son considerados de Public affairs consultant.  Sus padres nacieron en un pas de alto riesgo y usted no recibi una vacuna que lo proteja contra la hepatitis B (vacuna contra la hepatitis B).  [place] agujas  para inyectarse drogas.  Vive con alguien que tiene hepatitis B.  Ha tenido sexo con alguien que tiene hepatitis B.  Recibe tratamiento de hemodilisis.  Toma ciertos medicamentos para el cncer, trasplante de rganos y afecciones autoinmunitarias. Hepatitis C  Se recomienda un anlisis de [place] para:  Todos los que nacieron entre 1946 y [id number].  Todas las personas que tengan un riesgo de haber contrado hepatitis C. Enfermedades de transmisin sexual (ETS).  Debe realizarse pruebas de deteccin de enfermedades de transmisin sexual (ETS), incluidas gonorrea y clamidia si:  Es sexualmente activo y es menor de 24aos.  Es mayor de 24aos, y Investment banker, operational informa que corre riesgo de tener este tipo de infecciones.  La actividad sexual ha cambiado desde que le hicieron la ltima prueba de deteccin y tiene un riesgo mayor de Best boy clamidia o Radio broadcast assistant. Pregntele  al mdico si usted tiene riesgo.  Si no tiene el VIH, pero corre riesgo de infectarse por el virus, se recomienda tomar diariamente un medicamento recetado para evitar la infeccin. Esto se conoce como profilaxis previa a la exposicin. Se considera que est en riesgo si:  Es activo sexualmente y no usa preservativos habitualmente o no conoce el estado del VIH de sus parejas sexuales.  Se inyecta drogas.  Es activo sexualmente con una pareja que tiene VIH. Consulte a su mdico para saber si tiene un alto riesgo de infectarse por el VIH. Si opta por comenzar la profilaxis previa a la exposicin, primero debe realizarse anlisis de deteccin del VIH. Luego, le harn anlisis cada 3meses mientras est tomando los medicamentos para la profilaxis previa a la exposicin.  EMBARAZO   Si es premenopusica y puede quedar embarazada, solicite a su mdico asesoramiento previo a la concepcin.  Si puede quedar embarazada, tome 400 a 800microgramos (mcg) de cido flico todos los das.  Si desea evitar el embarazo, hable con su  mdico sobre el control de la natalidad (anticoncepcin). OSTEOPOROSIS Y MENOPAUSIA   La osteoporosis es una enfermedad en la que los huesos pierden los minerales y la fuerza por el avance de la edad. [name] resultado pueden ser fracturas graves en los huesos. [name] riesgo de osteoporosis puede identificarse con una prueba de densidad sea.  Si tiene 65aos o ms, o si est en riesgo de sufrir osteoporosis y fracturas, pregunte a su mdico si debe someterse a exmenes.  Consulte a su mdico si debe tomar un suplemento de calcio o de vitamina D para reducir el riesgo de osteoporosis.  La menopausia puede presentar ciertos sntomas fsicos y riesgos.  La terapia de reemplazo hormonal puede reducir algunos de estos sntomas y riesgos. Consulte a su mdico para saber si la terapia de reemplazo hormonal es conveniente para usted.  INSTRUCCIONES PARA EL CUIDADO EN EL HOGAR   Realcese los estudios de rutina de la salud, dentales y de la vista.  Mantngase al da con las vacunas.  No consuma ningn producto que contenga tabaco, lo que incluye cigarrillos, tabaco de mascar o cigarrillos electrnicos.  Si est embarazada, no beba alcohol.  Si est amamantando, reduzca el consumo de alcohol y la frecuencia con la que consume.  Si es mujer y no est embarazada limite el consumo de alcohol a no ms de 1 medida por da. Una medida equivale a 12onzas de cerveza, 5onzas de vino o 1onzas de bebidas alcohlicas de alta graduacin.  No consuma drogas.  No comparta agujas.  Solicite ayuda a su mdico si necesita apoyo o informacin para abandonar las drogas.  Informe a su mdico si a menudo se siente deprimido.  Notifique a su mdico si alguna vez ha sido vctima de abuso o si no se siente seguro en su hogar.   Esta informacin no tiene como fin reemplazar el consejo del mdico. Asegrese de hacerle al mdico cualquier pregunta que tenga.   Document Released: 03/24/2011 Document Revised:  04/25/2014 Elsevier Interactive Patient Education 2016 Elsevier Inc.  

## 2016-02-16 NOTE — Progress Notes (Signed)
Patient here for pap. C/o Ha x several days. Tried tylenol eases off then returns. LMP June; hx irrregular cycles

## 2016-02-16 NOTE — Progress Notes (Signed)
Subjective:  Patient ID: Jennifer Dunn, female    DOB: 1975-11-22  Age: 40 y.o. MRN: [id number]  CC: Annual Exam and Headache   HPI Remmi Armenteros presents for A complete physical exam. She had complained of headaches at her last visit which was attributed to uncontrolled blood pressure but blood pressure has improved significantly today she still has intermittent occipital headaches. Denies blurry vision, nausea or vomiting.  Past Medical History:  Diagnosis Date  . Diabetes mellitus without complication ([place])   . Hypertension     No past surgical history on file.  Outpatient Medications Prior to Visit  Medication Sig Dispense Refill  . atorvastatin (LIPITOR) 20 MG tablet Take 1 tablet (20 mg total) by mouth daily. 30 tablet 3  . cyclobenzaprine (FLEXERIL) 5 MG tablet Take 1 tablet (5 mg total) by mouth 3 (three) times daily as needed for muscle spasms. 30 tablet 1  . losartan (COZAAR) 50 MG tablet Take 1 tablet (50 mg total) by mouth daily. 30 tablet 5  . metFORMIN (GLUCOPHAGE XR) 500 MG 24 hr tablet Take 1 tablet (500 mg total) by mouth daily with breakfast. 30 tablet 5  . Multiple Vitamin (MULTIVITAMIN) tablet Take 1 tablet by mouth daily.    Marland Kitchen glucose blood test strip Use as instructed 100 each 12  . glucose monitoring kit (FREESTYLE) monitoring kit 1 each by Does not apply route as needed. 1 each 0  . Lancets (FREESTYLE) lancets Use as instructed 100 each 12  . traMADol (ULTRAM) 50 MG tablet Take 1 tablet (50 mg total) by mouth every 6 (six) hours as needed. (Patient not taking: Reported on 01/06/2016) 15 tablet 0   No facility-administered medications prior to visit.     ROS Review of Systems  Constitutional: Negative for activity change, appetite change and fatigue.  HENT: Negative for congestion, sinus pressure and sore throat.   Eyes: Negative for visual disturbance.  Respiratory: Negative for cough, chest tightness, shortness of breath and wheezing.    Cardiovascular: Negative for chest pain and palpitations.  Gastrointestinal: Negative for abdominal distention, abdominal pain and constipation.  Endocrine: Negative for polydipsia.  Genitourinary: Positive for menstrual problem (rregular periods). Negative for dysuria and frequency.  Musculoskeletal: Negative for arthralgias and back pain.  Skin: Negative for rash.  Neurological: Negative for tremors, light-headedness and numbness.  Hematological: Does not bruise/bleed easily.  Psychiatric/Behavioral: Negative for agitation and behavioral problems.    Objective:  BP (!) 140/94 Comment: manually  Pulse 87   Temp 98.1 F (36.7 C) (Oral)   Resp 20   Wt 184 lb (83.5 kg)   SpO2 99%   BMI 33.65 kg/m   BP/Weight 02/16/2016 08/04/3788 05/23/971  Systolic BP 532 992 426  Diastolic BP 94 83 834  Wt. (Lbs) 184 184.2 -  BMI 33.65 33.69 -     Physical Exam  Constitutional: She is oriented to person, place, and time. She appears well-developed and well-nourished. No distress.  HENT:  Head: Normocephalic.  Right Ear: External ear normal.  Left Ear: External ear normal.  Nose: Nose normal.  Mouth/Throat: Oropharynx is clear and moist.  Eyes: Conjunctivae and EOM are normal. Pupils are equal, round, and reactive to light.  Neck: Normal range of motion. No JVD present.  Cardiovascular: Normal rate, regular rhythm, normal heart sounds and intact distal pulses.  Exam reveals no gallop.   No murmur heard. Pulmonary/Chest: Effort normal and breath sounds normal. No respiratory distress. She has no wheezes. She has  no rales. She exhibits no tenderness. Right breast exhibits no mass and no tenderness. Left breast exhibits no mass and no tenderness.  Abdominal: Soft. Bowel sounds are normal. She exhibits no distension and no mass. There is no tenderness.  Genitourinary:  Genitourinary Comments: Normal external genitalia, normal vagina, normal cervix, no CMT  Musculoskeletal: Normal range of  motion. She exhibits no edema or tenderness.  Neurological: She is alert and oriented to person, place, and time. She has normal reflexes.  Skin: Skin is warm and dry. She is not diaphoretic.  Psychiatric: She has a normal mood and affect.    Lab Results  Component Value Date   HGBA1C 6.5 02/16/2016    Assessment & Plan:   1. Diabetes mellitus without complication ([place]) Controlled with A1c of 6.5 - POCT glucose (manual entry) - HgB A1c  2. Chronic tension-type headache, not intractable Likely tension - butalbital-acetaminophen-caffeine (FIORICET, ESGIC) 50-325-40 MG tablet; Take 1 tablet by mouth every 6 (six) hours as needed for headache.  Dispense: 30 tablet; Refill: 1  3. Screening for breast cancer Mammogram from 10/2015 was normal  4. Screening for cervical cancer - Cytology - PAP [place]  5. Irregular periods/menstrual cycles Has not used any contraception in the last few years Likely Climateric vs perimenopause  6. Need for Tdap vaccination - Tdap vaccine greater than or equal to 7yo IM   Meds ordered this encounter  Medications  . butalbital-acetaminophen-caffeine (FIORICET, ESGIC) 50-325-40 MG tablet    Sig: Take 1 tablet by mouth every 6 (six) hours as needed for headache.    Dispense:  30 tablet    Refill:  1    Follow-up: Return in about 3 months (around 05/18/2016) for follow up of Diabetets Mellitus.   Arnoldo Morale MD

## 2016-02-18 LAB — CERVICOVAGINAL ANCILLARY ONLY: CANDIDA VAGINITIS: NEGATIVE

## 2016-02-19 ENCOUNTER — Other Ambulatory Visit: Payer: Self-pay | Admitting: Family Medicine

## 2016-02-19 LAB — CYTOLOGY - PAP
CHLAMYDIA, DNA PROBE: NEGATIVE
Diagnosis: NEGATIVE
NEISSERIA GONORRHEA: NEGATIVE
TRICH (WINDOWPATH): NEGATIVE

## 2016-02-19 MED ORDER — METRONIDAZOLE 0.75 % VA GEL: 1.0000 | Freq: Every day | VAGINAL | 0 refills | Status: DC

## 2016-02-22 ENCOUNTER — Telehealth: Payer: Self-pay

## 2016-02-22 NOTE — Telephone Encounter (Signed)
-----   Message from Arnoldo Morale, MD sent at 02/19/2016  4:22 PM EDT ----- Pap smear is negative for malignancy, vaginal cultures are negative for STD but revealed presence of bacterial vaginitis for which I have sent a prescription to the pharmacy.

## 2016-02-22 NOTE — Telephone Encounter (Signed)
Writer with the help of Licensed conveyancer contacted patient who states understanding of lab results.  She will pick up meds to treat the bacterial vaginitis.

## 2016-03-03 MED FILL — VANDAZOLE VAGINAL 0.75% GEL: 0.75 | 7 days supply | Qty: 70 | Fill #0

## 2017-01-09 ENCOUNTER — Other Ambulatory Visit: Payer: Self-pay

## 2017-01-09 DIAGNOSIS — Z1231 Encounter for screening mammogram for malignant neoplasm of breast: Secondary | ICD-10-CM

## 2017-01-30 ENCOUNTER — Ambulatory Visit: Payer: Self-pay | Admitting: Internal Medicine

## 2017-02-09 ENCOUNTER — Other Ambulatory Visit: Payer: Self-pay | Admitting: Obstetrics and Gynecology

## 2017-02-09 DIAGNOSIS — Z1231 Encounter for screening mammogram for malignant neoplasm of breast: Secondary | ICD-10-CM

## 2017-02-16 ENCOUNTER — Ambulatory Visit (HOSPITAL_COMMUNITY): Payer: Self-pay

## 2017-02-21 ENCOUNTER — Encounter: Payer: Self-pay | Admitting: Internal Medicine

## 2017-02-21 ENCOUNTER — Ambulatory Visit: Payer: Self-pay | Admitting: Internal Medicine

## 2017-02-21 VITALS — BP 158/98 | HR 70 | Resp 12 | Ht 61.5 in | Wt 182.0 lb

## 2017-02-21 DIAGNOSIS — E782 Mixed hyperlipidemia: Secondary | ICD-10-CM

## 2017-02-21 DIAGNOSIS — F439 Reaction to severe stress, unspecified: Secondary | ICD-10-CM

## 2017-02-21 DIAGNOSIS — Z79899 Other long term (current) drug therapy: Secondary | ICD-10-CM

## 2017-02-21 DIAGNOSIS — E119 Type 2 diabetes mellitus without complications: Secondary | ICD-10-CM

## 2017-02-21 DIAGNOSIS — K029 Dental caries, unspecified: Secondary | ICD-10-CM

## 2017-02-21 DIAGNOSIS — Z23 Encounter for immunization: Secondary | ICD-10-CM

## 2017-02-21 DIAGNOSIS — E785 Hyperlipidemia, unspecified: Secondary | ICD-10-CM | POA: Insufficient documentation

## 2017-02-21 HISTORY — DX: Reaction to severe stress, unspecified: F43.9

## 2017-02-21 LAB — GLUCOSE, POCT (MANUAL RESULT ENTRY): POC Glucose: 253 mg/dl — AB (ref 70–99)

## 2017-02-21 MED ORDER — METFORMIN HCL ER 500 MG PO TB24: 500.0000 mg | ORAL_TABLET | Freq: Every day | ORAL | 11 refills | Status: DC

## 2017-02-21 MED ORDER — LISINOPRIL-HYDROCHLOROTHIAZIDE 10-12.5 MG PO TABS: 1.0000 | ORAL_TABLET | Freq: Every day | ORAL | 11 refills | Status: DC

## 2017-02-21 NOTE — Progress Notes (Signed)
   Subjective:    Patient ID: Jennifer Dunn, female    DOB: 1975/08/13, 41 y.o.   MRN: [id number]  HPI   New to establish Jorge's wife 1 hour late today:  Very limited history and exam. Has her own interpreter.  Has not been on medication for below issues for some time.  1.  Essential Hypertension:  Diagnosed for 4 years.  Was at one time on Lisinopril, but switched to Losartan as she was having headaches on the former.  Her headaches, however, did not improve.  She would prefer to be on Lisinopril for cost.   Appears she was actually taking Lisinopril/HCTZ  10/12.5 mg daily.   2.  DM:  Diagnosed 06/2014 with DM.  Was on Metformin and made dietary and physical activity changes to keep it controlled.  Last A1C was 02/16/2016.   Took Metformin XR 500 mg daily with Breakfast. Has not had eye check this year. Did have influenza vaccine with orange card sign up. Has not had pneumococcal vaccine.  3.  Hyperlipidemia:  Was taking Atorvastatin 20 mg daily.  Only took for 2 months.    No outpatient medications have been marked as taking for the 02/21/17 encounter (Office Visit) with Mack Hook, MD.    No Known Allergies   Review of Systems     Objective:   Physical Exam NAD HEENT:  PERRL, EOMI, TMs pearly gray, throat without injection. Scattered dental decay Neck:  Supple, No adenopathy, no thyromegaly Chest:  CTA CV:  RRR with normal S1 and S2, No S3, S4 or murmur.  Radial and DP pulses normal and equal LE: No edema.       Assessment & Plan:  1.  Essential Hypertension:  Restart Lisinopril/HCTZ 10 mg/12.5 mg daily in the morning. CMP today BMP in 1 week with BP check.  2.  DM:  Confirmed with her she does not have prediabetes, but DM, which she did not seem aware.   Eye referral in 3 months, after saving up for fee. Influenza up to date Pneumococcal 23 v today.  3.  Dental decay:  Dental referral.  4.  Stress:  Will ask Macie Burows, LCSW to call for  needs. Follow up in 2 months with me and will go over history more thoroughly. Husband quite ill with DM and likely with much stress due to this/financial status without him working.

## 2017-02-22 LAB — COMPREHENSIVE METABOLIC PANEL
ALBUMIN: 3.8 g/dL (ref 3.5–5.5)
ALK PHOS: 110 IU/L (ref 39–117)
ALT: 21 IU/L (ref 0–32)
AST: 21 IU/L (ref 0–40)
Albumin/Globulin Ratio: 1.2 (ref 1.2–2.2)
BUN / CREAT RATIO: 13 (ref 9–23)
BUN: 13 mg/dL (ref 6–24)
CO2: 25 mmol/L (ref 20–29)
CREATININE: 0.97 mg/dL (ref 0.57–1.00)
Calcium: 8.8 mg/dL (ref 8.7–10.2)
Chloride: 101 mmol/L (ref 96–106)
GFR calc non Af Amer: 72 mL/min/{1.73_m2} (ref >59)
GFR, EST AFRICAN AMERICAN: 83 mL/min/{1.73_m2} (ref >59)
GLOBULIN, TOTAL: 3.3 g/dL (ref 1.5–4.5)
Glucose: 156 mg/dL — ABNORMAL HIGH (ref 65–99)
Potassium: 3.9 mmol/L (ref 3.5–5.2)
SODIUM: 141 mmol/L (ref 134–144)
TOTAL PROTEIN: 7.1 g/dL (ref 6.0–8.5)

## 2017-02-28 ENCOUNTER — Ambulatory Visit: Payer: Self-pay

## 2017-02-28 ENCOUNTER — Other Ambulatory Visit: Payer: Self-pay | Admitting: Licensed Clinical Social Worker

## 2017-02-28 VITALS — BP 130/90 | HR 70

## 2017-02-28 DIAGNOSIS — Z79899 Other long term (current) drug therapy: Secondary | ICD-10-CM

## 2017-02-28 DIAGNOSIS — I10 Essential (primary) hypertension: Secondary | ICD-10-CM

## 2017-03-01 LAB — BASIC METABOLIC PANEL
BUN / CREAT RATIO: 15 (ref 9–23)
BUN: 17 mg/dL (ref 6–24)
CO2: 28 mmol/L (ref 20–29)
CREATININE: 1.11 mg/dL — AB (ref 0.57–1.00)
Calcium: 9 mg/dL (ref 8.7–10.2)
Chloride: 99 mmol/L (ref 96–106)
GFR, EST AFRICAN AMERICAN: 71 mL/min/{1.73_m2} (ref >59)
GFR, EST NON AFRICAN AMERICAN: 61 mL/min/{1.73_m2} (ref >59)
GLUCOSE: 139 mg/dL — AB (ref 65–99)
POTASSIUM: 4 mmol/L (ref 3.5–5.2)
SODIUM: 141 mmol/L (ref 134–144)

## 2017-03-07 ENCOUNTER — Ambulatory Visit: Payer: Self-pay | Admitting: Licensed Clinical Social Worker

## 2017-03-07 DIAGNOSIS — F439 Reaction to severe stress, unspecified: Secondary | ICD-10-CM

## 2017-03-08 NOTE — Progress Notes (Signed)
   THERAPY PROGRESS NOTE  Session Time: 71min  Participation Level: Active  Behavioral Response: CasualAlertEuthymic  Type of Therapy: Individual Therapy  Treatment Goals addressed: Coping  Interventions: Motivational Interviewing and Supportive  Summary: Jennifer Dunn is a 41 y.o. female who presents with a euthymic mood and appropriate affect. She reported that she was seeking counseling due to overwhelming stress. She shared that her husband has serious medical/health problems due to diabetes and kidney problems. She reported that he was hospitalized for a week in September 2018 and that all the family and household duties were completely on her shoulders. Jennifer Dunn also shared about her upbringing in [place] and how she immigrated to the [place]. She became tearful as she shared about deciding to leave her 83-year-old daughter in [place] when she immigrated. She shared about the experience of trying to stay connected with her daughter while they were separated for 5 years and she had her next daughter. Jennifer Dunn shared that she is also experiencing financial stress, which makes her feel irritable and angry. She shared about her relationship with each of her children.  Suicidal/Homicidal: Nowithout intent/plan  Therapist Response: LCSW began the clinical assessment but was unable to finish due to time constraints.LCSW utilized supportive counseling techniques throughout the session in order to validate emotions and encourage open expression of emotion. LCSW inquired about Jennifer Dunn' coping skills several times but Jennifer Dunn was unable to name specific ways that she deals with stress.  Plan: Return again in 2 weeks.    Metta Clines, LCSW 03/08/2017

## 2017-03-21 ENCOUNTER — Other Ambulatory Visit: Payer: Self-pay | Admitting: Licensed Clinical Social Worker

## 2017-03-27 ENCOUNTER — Ambulatory Visit: Payer: Self-pay | Admitting: Internal Medicine

## 2017-03-28 ENCOUNTER — Ambulatory Visit (HOSPITAL_COMMUNITY): Payer: Self-pay

## 2017-03-28 ENCOUNTER — Other Ambulatory Visit: Payer: Self-pay | Admitting: Licensed Clinical Social Worker

## 2017-03-30 ENCOUNTER — Ambulatory Visit (HOSPITAL_COMMUNITY)
Admission: RE | Admit: 2017-03-30 | Discharge: 2017-03-30 | Disposition: A | Discharge status: Home / Self Care | Payer: No Typology Code available for payment source | Source: Ambulatory Visit | Attending: Obstetrics and Gynecology | Admitting: Obstetrics and Gynecology

## 2017-03-30 ENCOUNTER — Encounter (HOSPITAL_COMMUNITY): Payer: Self-pay

## 2017-03-30 ENCOUNTER — Ambulatory Visit
Admission: RE | Admit: 2017-03-30 | Discharge: 2017-03-30 | Disposition: A | Discharge status: Home / Self Care | Payer: No Typology Code available for payment source | Source: Ambulatory Visit | Attending: Obstetrics and Gynecology | Admitting: Obstetrics and Gynecology

## 2017-03-30 ENCOUNTER — Ambulatory Visit (HOSPITAL_COMMUNITY): Payer: Self-pay

## 2017-03-30 VITALS — BP 150/100 | Temp 98.7°F | Wt 181.0 lb

## 2017-03-30 DIAGNOSIS — Z1239 Encounter for other screening for malignant neoplasm of breast: Secondary | ICD-10-CM

## 2017-03-30 DIAGNOSIS — Z1231 Encounter for screening mammogram for malignant neoplasm of breast: Secondary | ICD-10-CM

## 2017-03-30 NOTE — Patient Instructions (Signed)
Explained breast self awareness with Isaac Laud. Patient did not need a Pap smear today due to last Pap smear was 02/16/2016. Let her know [facility] will cover Pap smears every 3 years unless has a history of abnormal Pap smears. Referred patient to the [facility] for a screening mammogram. Appointment scheduled for Thursday, March 30, 2017 at 1510. Let [facility] know the [facility] will follow up with her within the next couple weeks with results of mammogram by letter or phone. Isaac Laud verbalized understanding.  Deryl Giroux, Arvil Chaco, RN 2:54 PM

## 2017-03-30 NOTE — Progress Notes (Signed)
Patient complained of right outer breast pain and swelling that radiated to the back two weeks ago that has since resolved. 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 Patient feels that the soreness was related to her job.    Pap Smear: Pap smear not completed today. Last Pap smear was 02/16/2016 at [facility] and normal. Per patient has no history of an abnormal Pap smear. Last Pap smear result is in Epic.  Physical exam: Breasts Breasts symmetrical. No skin abnormalities bilateral breasts. No nipple retraction bilateral breasts. No nipple discharge bilateral breasts. No lymphadenopathy. No lumps palpated bilateral breasts. Patient complained of right axillary tenderness on exam. Referred patient to the [facility] for a screening mammogram. Appointment scheduled for Thursday, March 30, 2017 at 1510.        Pelvic/Bimanual No Pap smear completed today since last Pap smear was 02/16/2016. Pap smear not indicated per BCCCP guidelines.   Smoking History: Patient has never smoked.  Patient Navigation: Patient education provided. Access to services provided for patient through [facility] program. Spanish interpreter provided.  Used Spanish interpreter [employer] from [place].

## 2017-03-31 ENCOUNTER — Encounter: Payer: Self-pay | Admitting: Internal Medicine

## 2017-03-31 ENCOUNTER — Ambulatory Visit: Payer: Self-pay | Admitting: Internal Medicine

## 2017-03-31 ENCOUNTER — Encounter (HOSPITAL_COMMUNITY): Payer: Self-pay

## 2017-03-31 ENCOUNTER — Other Ambulatory Visit: Payer: Self-pay | Admitting: Licensed Clinical Social Worker

## 2017-03-31 VITALS — BP 132/98 | HR 66 | Resp 12 | Ht 61.5 in | Wt 180.0 lb

## 2017-03-31 DIAGNOSIS — R7989 Other specified abnormal findings of blood chemistry: Secondary | ICD-10-CM

## 2017-03-31 DIAGNOSIS — I10 Essential (primary) hypertension: Secondary | ICD-10-CM

## 2017-03-31 DIAGNOSIS — E119 Type 2 diabetes mellitus without complications: Secondary | ICD-10-CM

## 2017-03-31 LAB — GLUCOSE, POCT (MANUAL RESULT ENTRY): POC GLUCOSE: 128 mg/dL — AB (ref 70–99)

## 2017-03-31 MED ORDER — AGAMATRIX PRESTO PRO METER DEVI: 0 refills | Status: AC

## 2017-03-31 MED ORDER — GLUCOSE BLOOD VI STRP: ORAL_STRIP | 12 refills | Status: DC

## 2017-03-31 MED ORDER — AGAMATRIX ULTRA-THIN LANCETS MISC: 11 refills | Status: DC

## 2017-03-31 NOTE — Patient Instructions (Signed)
Keep appointment in January
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
## 2017-04-01 LAB — BASIC METABOLIC PANEL
BUN/Creatinine Ratio: 16 (ref 9–23)
BUN: 18 mg/dL (ref 6–24)
CO2: 28 mmol/L (ref 20–29)
Calcium: 9.1 mg/dL (ref 8.7–10.2)
Chloride: 101 mmol/L (ref 96–106)
Creatinine, Ser: 1.13 mg/dL — ABNORMAL HIGH (ref 0.57–1.00)
GFR calc Af Amer: 69 mL/min/{1.73_m2} (ref >59)
GFR, EST NON AFRICAN AMERICAN: 60 mL/min/{1.73_m2} (ref >59)
GLUCOSE: 112 mg/dL — AB (ref 65–99)
POTASSIUM: 4.4 mmol/L (ref 3.5–5.2)
SODIUM: 140 mmol/L (ref 134–144)

## 2017-04-03 ENCOUNTER — Other Ambulatory Visit: Payer: Self-pay | Admitting: Obstetrics and Gynecology

## 2017-04-03 DIAGNOSIS — R928 Other abnormal and inconclusive findings on diagnostic imaging of breast: Secondary | ICD-10-CM

## 2017-04-05 ENCOUNTER — Other Ambulatory Visit: Payer: No Typology Code available for payment source

## 2017-04-07 ENCOUNTER — Other Ambulatory Visit: Payer: Self-pay | Admitting: Licensed Clinical Social Worker

## 2017-04-10 ENCOUNTER — Other Ambulatory Visit: Payer: No Typology Code available for payment source

## 2017-04-14 ENCOUNTER — Other Ambulatory Visit: Payer: Self-pay | Admitting: Obstetrics and Gynecology

## 2017-04-14 ENCOUNTER — Ambulatory Visit
Admission: RE | Admit: 2017-04-14 | Discharge: 2017-04-14 | Disposition: A | Discharge status: Home / Self Care | Payer: No Typology Code available for payment source | Source: Ambulatory Visit | Attending: Obstetrics and Gynecology | Admitting: Obstetrics and Gynecology

## 2017-04-14 DIAGNOSIS — R928 Other abnormal and inconclusive findings on diagnostic imaging of breast: Secondary | ICD-10-CM

## 2017-04-14 DIAGNOSIS — N6310 Unspecified lump in the right breast, unspecified quadrant: Secondary | ICD-10-CM

## 2017-04-27 ENCOUNTER — Encounter: Payer: Self-pay | Admitting: Internal Medicine

## 2017-04-27 ENCOUNTER — Ambulatory Visit: Payer: Self-pay | Admitting: Internal Medicine

## 2017-04-27 VITALS — BP 130/88 | HR 88 | Resp 12 | Ht 61.5 in | Wt 181.0 lb

## 2017-04-27 DIAGNOSIS — E119 Type 2 diabetes mellitus without complications: Secondary | ICD-10-CM

## 2017-04-27 DIAGNOSIS — R7989 Other specified abnormal findings of blood chemistry: Secondary | ICD-10-CM

## 2017-04-27 DIAGNOSIS — Z6833 Body mass index (BMI) 33.0-33.9, adult: Secondary | ICD-10-CM

## 2017-04-27 DIAGNOSIS — I10 Essential (primary) hypertension: Secondary | ICD-10-CM

## 2017-04-27 DIAGNOSIS — E669 Obesity, unspecified: Secondary | ICD-10-CM

## 2017-04-27 DIAGNOSIS — F439 Reaction to severe stress, unspecified: Secondary | ICD-10-CM

## 2017-04-27 LAB — GLUCOSE, POCT (MANUAL RESULT ENTRY): POC Glucose: 144 mg/dl — AB (ref 70–99)

## 2017-04-27 MED ORDER — CYCLOBENZAPRINE HCL 5 MG PO TABS: ORAL_TABLET | ORAL | 0 refills | Status: DC

## 2017-04-27 NOTE — Progress Notes (Signed)
   Subjective:    Patient ID: Jennifer Dunn, female    DOB: 1975/05/16, 42 y.o.   MRN: [id number]  HPI   1.  Essential Hypertension:  Restarted on Lisinopril/HCTZ 10/12.5 03/29/2017.  Tolerating fine.  Her creatinine was just a bit high previously after restart of Lisinopril/HCTZ in Sept./Oct.  2.  Headache from back of neck for 2-3 weeks.  Has a lot of tension in neck.  Husband, Jennifer Dunn,  has been very ill in the hospital and lots of work with family and job. She has spoken with Jennifer Clines, LCSW.  She would be open to a telephone appointment or getting set back up with her.  Missed he last appt with her.  3.  DM:  Just established in November 2018.  Only missed 1 week of Metformin at beginning of December.   Is checking sugars at home.  Max has been 155.  Checking mainly fasting in the morning, but has checked in afternoon as well.  Does have a history of microalbuminuria.  Have not checked yet since established as getting her back on ACE I for a period of time.  Current Meds  Medication Sig  . AGAMATRIX ULTRA-THIN LANCETS MISC Check sugars before meals twice daily  . Blood Glucose Monitoring Suppl (AGAMATRIX PRESTO PRO METER) DEVI Check blood sugar twice daily before breakfast and dinner  . glucose blood (AGAMATRIX PRESTO TEST) test strip Check sugars twice daily before meals  . lisinopril-hydrochlorothiazide (PRINZIDE,ZESTORETIC) 10-12.5 MG tablet Take 1 tablet daily by mouth.  . metFORMIN (GLUCOPHAGE-XR) 500 MG 24 hr tablet Take 1 tablet (500 mg total) daily with breakfast by mouth.    No Known Allergies    Review of Systems     Objective:   Physical Exam  NAD HEENT:  PERRL EOMI, TMs pearly gray, throat without injection. Neck:  Tender over traps to nuchal ridge. Supple, No adenopathy Chest:  CTA CV:  RRR without murmur or rub, radial and DP pulses normal and equal. LE:  No edema.         Assessment & Plan:  1. Essential Hypertension:  Improved with regular  use of Lisinopril/HCTZ, though would like to see a bit lower.   Recheck BMP:  If increasing, switch to plain Lisinopril and consider addition of Amlodipine or Metoprolol.  2.  DM:  A1C. Describes good blood glucose when she checks.  To continue to work on diet and physical activity.   Eye referral. Immunizations up to date.  3.  Neck Pain and stress:  Will ask her to see Jennifer Burows, LCSW to counsel with her current significant stressors.  Cyclobenzaprine 5-10 mg at bedtime for neck and headache pain.   Neck stretches.

## 2017-04-29 ENCOUNTER — Other Ambulatory Visit: Payer: Self-pay | Admitting: Internal Medicine

## 2017-04-29 LAB — BASIC METABOLIC PANEL
BUN/Creatinine Ratio: 19 (ref 9–23)
BUN: 22 mg/dL (ref 6–24)
CALCIUM: 9 mg/dL (ref 8.7–10.2)
CO2: 25 mmol/L (ref 20–29)
CREATININE: 1.18 mg/dL — AB (ref 0.57–1.00)
Chloride: 100 mmol/L (ref 96–106)
GFR calc Af Amer: 66 mL/min/{1.73_m2} (ref >59)
GFR, EST NON AFRICAN AMERICAN: 57 mL/min/{1.73_m2} — AB (ref >59)
Glucose: 125 mg/dL — ABNORMAL HIGH (ref 65–99)
Potassium: 4.7 mmol/L (ref 3.5–5.2)
Sodium: 138 mmol/L (ref 134–144)

## 2017-04-29 LAB — HGB A1C W/O EAG: Hgb A1c MFr Bld: 7 % — ABNORMAL HIGH (ref 4.8–5.6)

## 2017-04-29 MED ORDER — METFORMIN HCL ER 500 MG PO TB24: ORAL_TABLET | ORAL | 11 refills | Status: DC

## 2017-04-29 MED ORDER — LISINOPRIL 10 MG PO TABS: 10.0000 mg | ORAL_TABLET | Freq: Every day | ORAL | 11 refills | Status: DC

## 2017-04-29 MED ORDER — METOPROLOL TARTRATE 25 MG PO TABS: 12.5000 mg | ORAL_TABLET | Freq: Two times a day (BID) | ORAL | 11 refills | Status: DC

## 2017-04-29 NOTE — Progress Notes (Signed)
Changing meds with recent lab results:  Discontinue HCTZ, add Metoprolol low dose and increase Metformin XR

## 2017-05-11 ENCOUNTER — Emergency Department (HOSPITAL_COMMUNITY)
Admission: EM | Admit: 2017-05-11 | Discharge: 2017-05-11 | Disposition: A | Discharge status: Home / Self Care | Payer: No Typology Code available for payment source | Attending: Emergency Medicine | Admitting: Emergency Medicine

## 2017-05-11 ENCOUNTER — Encounter (HOSPITAL_COMMUNITY): Payer: Self-pay

## 2017-05-11 ENCOUNTER — Emergency Department (HOSPITAL_COMMUNITY): Payer: No Typology Code available for payment source

## 2017-05-11 ENCOUNTER — Other Ambulatory Visit: Payer: Self-pay

## 2017-05-11 DIAGNOSIS — E119 Type 2 diabetes mellitus without complications: Secondary | ICD-10-CM | POA: Insufficient documentation

## 2017-05-11 DIAGNOSIS — G43009 Migraine without aura, not intractable, without status migrainosus: Secondary | ICD-10-CM

## 2017-05-11 DIAGNOSIS — Z7984 Long term (current) use of oral hypoglycemic drugs: Secondary | ICD-10-CM | POA: Insufficient documentation

## 2017-05-11 DIAGNOSIS — H53149 Visual discomfort, unspecified: Secondary | ICD-10-CM | POA: Insufficient documentation

## 2017-05-11 DIAGNOSIS — I10 Essential (primary) hypertension: Secondary | ICD-10-CM | POA: Insufficient documentation

## 2017-05-11 DIAGNOSIS — G5601 Carpal tunnel syndrome, right upper limb: Secondary | ICD-10-CM | POA: Insufficient documentation

## 2017-05-11 DIAGNOSIS — Z79899 Other long term (current) drug therapy: Secondary | ICD-10-CM | POA: Insufficient documentation

## 2017-05-11 LAB — I-STAT BETA HCG BLOOD, ED (MC, WL, AP ONLY)

## 2017-05-11 LAB — CBG MONITORING, ED: Glucose-Capillary: 90 mg/dL (ref 65–99)

## 2017-05-11 MED ORDER — KETOROLAC TROMETHAMINE 15 MG/ML IJ SOLN
15.0000 mg | Freq: Once | INTRAMUSCULAR | Status: AC
  Administered 2017-05-11: 15 mg via INTRAVENOUS
  Filled 2017-05-11: qty 1

## 2017-05-11 MED ORDER — SODIUM CHLORIDE 0.9 % IV BOLUS (SEPSIS)
1000.0000 mL | Freq: Once | INTRAVENOUS | Status: AC
  Administered 2017-05-11: 1000 mL via INTRAVENOUS

## 2017-05-11 MED ORDER — PROCHLORPERAZINE EDISYLATE 5 MG/ML IJ SOLN
5.0000 mg | Freq: Once | INTRAMUSCULAR | Status: AC
  Administered 2017-05-11: 5 mg via INTRAVENOUS
  Filled 2017-05-11: qty 2

## 2017-05-11 MED ORDER — NAPROXEN 500 MG PO TABS: 500.0000 mg | ORAL_TABLET | Freq: Two times a day (BID) | ORAL | 0 refills | Status: DC

## 2017-05-11 MED ORDER — DIPHENHYDRAMINE HCL 50 MG/ML IJ SOLN
12.5000 mg | Freq: Once | INTRAMUSCULAR | Status: AC
  Administered 2017-05-11: 12.5 mg via INTRAVENOUS
  Filled 2017-05-11: qty 1

## 2017-05-11 NOTE — ED Provider Notes (Signed)
[facility] DEPT Provider Note   CSN: [id number] Arrival date & time: 05/11/17  1214     History   Chief Complaint Chief Complaint  Patient presents with  . Headache    HPI Jennifer Dunn is a 42 y.o. female with a past medical history of diabetes, hypertension, hyperlipidemia, who presents to ED for evaluation of 24-hour history of left-sided headache.  She states that the symptoms began last night suddenly.  She has taken Tylenol and ibuprofen with only mild relief in her symptoms.  She states that she sometimes gets headaches due to her hypertension states that this feels different.  She has associated photophobia and phonophobia. She denies history of migraines in the past. Denies any nausea, vomiting, vision changes, head injuries, falls, numbness in arms or legs.  She does state that she has intermittent tingling sensation in her right wrist and fingers especially while she is at work. She works cleaning, mopping and vaccuming a clinic.  She denies any chest pain, abdominal pain, fevers, neck pain, changes with gait.  She reports compliance with her home medications.  The history is provided by the patient. A language interpreter was used.    Past Medical History:  Diagnosis Date  . Diabetes mellitus without complication ([place])   . Hyperlipidemia   . Hypertension   . Stress 02/21/2017    Patient Active Problem List   Diagnosis Date Noted  . Hyperlipidemia 02/21/2017  . Stress 02/21/2017  . Diabetes mellitus without complication ([place]) 93/81/8299  . Hypertension 01/06/2016    History reviewed. No pertinent surgical history.  OB History    Gravida Para Term Preterm AB Living   6       2 4    SAB TAB Ectopic Multiple Live Births   2               Home Medications    Prior to Admission medications   Medication Sig Start Date End Date Taking? Authorizing Provider  Truddie Crumble ULTRA-THIN LANCETS MISC Check sugars before meals twice  daily 03/31/17   Mack Hook, MD  atorvastatin (LIPITOR) 20 MG tablet Take 1 tablet (20 mg total) by mouth daily. Patient not taking: Reported on 02/21/2017 01/11/16   Charlott Rakes, MD  Blood Glucose Monitoring Suppl (AGAMATRIX PRESTO PRO METER) DEVI Check blood sugar twice daily before breakfast and dinner 03/31/17   Mack Hook, MD  cyclobenzaprine (FLEXERIL) 5 MG tablet 1/2 to 1 tab by mouth at bedtime for neck pain 04/27/17   Mack Hook, MD  glucose blood (AGAMATRIX PRESTO TEST) test strip Check sugars twice daily before meals 03/31/17   Mack Hook, MD  lisinopril (PRINIVIL,ZESTRIL) 10 MG tablet Take 1 tablet (10 mg total) by mouth daily. 04/29/17   Mack Hook, MD  metFORMIN (GLUCOPHAGE-XR) 500 MG 24 hr tablet 1 tab by mouth with meal twice daily 04/29/17   Mack Hook, MD  metoprolol tartrate (LOPRESSOR) 25 MG tablet Take 0.5 tablets (12.5 mg total) by mouth 2 (two) times daily. 04/29/17   Mack Hook, MD  Multiple Vitamin (MULTIVITAMIN) tablet Take 1 tablet by mouth daily.    [provider]  naproxen (NAPROSYN) 500 MG tablet Take 1 tablet (500 mg total) by mouth 2 (two) times daily. 05/11/17   Delia Heady, PA-C    Family History Family History  Problem Relation Age of Onset  . Diabetes Maternal Grandmother   . Cancer Paternal Grandmother        Uterine    Social  History Social History   Tobacco Use  . Smoking status: Never Smoker  . Smokeless tobacco: Never Used  Substance Use Topics  . Alcohol use: No  . Drug use: No     Allergies   Patient has no known allergies.   Review of Systems Review of Systems  Constitutional: Negative for appetite change, chills and fever.  HENT: Negative for ear pain, rhinorrhea, sneezing and sore throat.   Eyes: Positive for photophobia. Negative for visual disturbance.  Respiratory: Negative for cough, chest tightness, shortness of breath and wheezing.   Cardiovascular:  Negative for chest pain and palpitations.  Gastrointestinal: Negative for abdominal pain, blood in stool, constipation, diarrhea, nausea and vomiting.  Genitourinary: Negative for dysuria, hematuria and urgency.  Musculoskeletal: Negative for myalgias, neck pain and neck stiffness.  Skin: Negative for rash.  Neurological: Positive for headaches. Negative for dizziness, weakness and light-headedness.     Physical Exam Updated Vital Signs BP 125/74 (BP Location: Left Arm)   Pulse 65   Temp 98.5 F (36.9 C) (Oral)   Resp 18   Ht 5' 1.5" (1.562 m)   Wt 82.1 kg (181 lb)   LMP 04/25/2017   SpO2 98%   BMI 33.65 kg/m   Physical Exam  Constitutional: She is oriented to person, place, and time. She appears well-developed and well-nourished. No distress.  Nontoxic appearing and in no acute distress.  Speaking in complete sentences without difficulty.  HENT:  Head: Normocephalic and atraumatic.  Nose: Nose normal.  Eyes: Conjunctivae and EOM are normal. Right eye exhibits no discharge. Left eye exhibits no discharge. No scleral icterus.  Neck: Normal range of motion. Neck supple.    No neck tenderness to palpation or changes with range of motion.  No meningismus. Patient states that pain is located in the indicated area of the image.  Cardiovascular: Normal rate, regular rhythm, normal heart sounds and intact distal pulses. Exam reveals no gallop and no friction rub.  No murmur heard. Pulmonary/Chest: Effort normal and breath sounds normal. No respiratory distress.  Abdominal: Soft. Bowel sounds are normal. She exhibits no distension. There is no tenderness. There is no guarding.  Musculoskeletal: Normal range of motion. She exhibits no edema.  Positive Tinel's and Phalen's test c/w carpal tunnel.  Neurological: She is alert and oriented to person, place, and time. No cranial nerve deficit or sensory deficit. She exhibits normal muscle tone. Coordination normal.  Pupils reactive. No  facial asymmetry noted. Cranial nerves appear grossly intact. Sensation intact to light touch on face, BUE and BLE. Strength 5/5 in BUE and BLE. Normal finger to nose coordination bilaterally.  Skin: Skin is warm and dry. No rash noted.  Psychiatric: She has a normal mood and affect.  Nursing note and vitals reviewed.    ED Treatments / Results  Labs (all labs ordered are listed, but only abnormal results are displayed) Labs Reviewed  I-STAT BETA HCG BLOOD, ED ([facility], [facility], [facility] ONLY)  CBG MONITORING, ED    EKG  EKG Interpretation None       Radiology Ct Head Wo Contrast  Result Date: 05/11/2017 CLINICAL DATA:  Occipital headache EXAM: CT HEAD WITHOUT CONTRAST TECHNIQUE: Contiguous axial images were obtained from the base of the skull through the vertex without intravenous contrast. COMPARISON:  None. FINDINGS: Brain: No evidence of acute infarction, hemorrhage, hydrocephalus, extra-axial collection or mass lesion/mass effect. Vascular: No hyperdense vessel or unexpected calcification. Skull: Normal. Negative for fracture or focal lesion. Sinuses/Orbits: Mild ethmoid sinus mucosal  thickening. Air-fluid levels. Clear mastoids. Intact ocular globes and orbits. Other: None. IMPRESSION: No acute intracranial abnormality. Mild ethmoid sinus mucosal thickening. Electronically Signed   By: Ashley Royalty M.D.   On: 05/11/2017 18:50    Procedures Procedures (including critical care time)  Medications Ordered in ED Medications  sodium chloride 0.9 % bolus 1,000 mL (0 mLs Intravenous Stopped 05/11/17 2001)  prochlorperazine (COMPAZINE) injection 5 mg (5 mg Intravenous Given 05/11/17 1959)  diphenhydrAMINE (BENADRYL) injection 12.5 mg (12.5 mg Intravenous Given 05/11/17 1959)  ketorolac (TORADOL) 15 MG/ML injection 15 mg (15 mg Intravenous Given 05/11/17 1956)     Initial Impression / Assessment and Plan / ED Course  I have reviewed the triage vital signs and the nursing notes.  Pertinent labs &  imaging results that were available during my care of the patient were reviewed by me and considered in my medical decision making (see chart for details).     Patient presents to ED for evaluation of 24-hour.  States this feels different than the headaches that she gets as a result of her hypertension.  She reports compliance with her home hypertension medications.  She also reports associated photophobia and phonophobia.  Denies history of migraines in the past and denies any nausea, vomiting, vision changes, head injuries, numbness in arms or legs.  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  On physical exam she is overall well-appearing.  She is afebrile and she has no meningeal signs.  She has no deficits on her neurological exam.  She does have positive Phalen's and Tinel's test that would signify carpal tunnel syndrome. CT head was done due to change in characteristic of headaches and acute onset, and return as negative for acute abnormality.  Patient reports complete resolution of her symptoms with migraine cocktail given here.  I suspect that her symptoms are due to a migraine rather than traumatic injury, infectious cause or increased ICP.  Will discharge home with naproxen to help with both headaches and carpal tunnel as well as a wrist splint.  Advised to follow-up with primary care provider for further evaluation.  Appears stable for discharge at this time.  Strict return precautions given.  Final Clinical Impressions(s) / ED Diagnoses   Final diagnoses:  Migraine without aura and without status migrainosus, not intractable  Carpal tunnel syndrome of right wrist    ED Discharge Orders        Ordered    naproxen (NAPROSYN) 500 MG tablet  2 times daily     05/11/17 2110     Portions of this note were generated with Dragon dictation software. Dictation errors may occur despite  best attempts at proofreading.    Delia Heady, PA-C 05/11/17 2115    Virgel Manifold, MD 05/12/17 [phone]

## 2017-05-11 NOTE — Discharge Instructions (Signed)
Please read attached information regarding your condition. Take naproxen as needed for headaches. Follow-up with your primary care provider for further evaluation. Continue your home medications as previously prescribed. Return to ED for worsening headache, vision changes, head injuries or falls, numbness in arms or legs.   Por favor lea la informacin adjunta sobre su condicin. Tome naproxeno segn sea necesario para los dolores de [place]. Haga un seguimiento con su proveedor de atencin primaria para una evaluacin adicional. Contine con sus medicamentos caseros como se prescribi anteriormente. Regrese a la ED para Occupational hygienist de cabeza, cambios en la visin, lesiones en la cabeza o cadas, entumecimiento en brazos o piernas.

## 2017-05-12 ENCOUNTER — Other Ambulatory Visit: Payer: Self-pay

## 2017-05-12 MED ORDER — ATORVASTATIN CALCIUM 20 MG PO TABS: 20.0000 mg | ORAL_TABLET | Freq: Every day | ORAL | 11 refills | Status: DC

## 2017-05-12 MED ORDER — CYCLOBENZAPRINE HCL 5 MG PO TABS: ORAL_TABLET | ORAL | 0 refills | Status: DC

## 2017-05-17 ENCOUNTER — Other Ambulatory Visit: Payer: Self-pay

## 2017-06-22 ENCOUNTER — Ambulatory Visit: Payer: Self-pay | Admitting: Internal Medicine

## 2017-06-22 ENCOUNTER — Encounter: Payer: Self-pay | Admitting: Internal Medicine

## 2017-06-22 VITALS — BP 150/80 | HR 88 | Resp 12 | Ht 61.5 in | Wt 186.0 lb

## 2017-06-22 DIAGNOSIS — E669 Obesity, unspecified: Secondary | ICD-10-CM | POA: Insufficient documentation

## 2017-06-22 DIAGNOSIS — G5601 Carpal tunnel syndrome, right upper limb: Secondary | ICD-10-CM

## 2017-06-22 DIAGNOSIS — E119 Type 2 diabetes mellitus without complications: Secondary | ICD-10-CM

## 2017-06-22 DIAGNOSIS — R4589 Other symptoms and signs involving emotional state: Secondary | ICD-10-CM

## 2017-06-22 DIAGNOSIS — Z0000 Encounter for general adult medical examination without abnormal findings: Secondary | ICD-10-CM

## 2017-06-22 LAB — GLUCOSE, POCT (MANUAL RESULT ENTRY): POC Glucose: 201 mg/dl — AB (ref 70–99)

## 2017-06-22 NOTE — Progress Notes (Signed)
Subjective:    Patient ID: Jennifer Dunn, female    DOB: October 14, 1975, 42 y.o.   MRN: [id number]  HPI  CPE without pap  1.  Pap:  Last 01/2016 and normal.  Paternal grandmother with history of cervical cancer.  2.  Mammogram:  Persistent asymmetry in right breast on mammogram.  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  She is to followup in 6 months (June 2019).  No family history of breast cancer.  3.  Osteoprevention:  Has cheese daily.  Does not like dairy.  Does like yogurt and would be willing to eat that daily.  Is not physically active.  States too cold to go out. No family history of osteoporosis.  4.  Guaiac Cards:  Never.  No family history of colon cancer.  5.  Colonoscopy:  Never.  6.  Immunizations:   Immunization History  Administered Date(s) Administered  . Influenza Split 01/05/2017  . Influenza,inj,Quad PF,6+ Mos 02/19/2015  . Pneumococcal Polysaccharide-23 02/21/2017  . Tdap 02/16/2016    7.  Glucose/Cholesterol:  Last A1C 04/27/17:  7.0%, history of hyperlipidemia.  Has not had cholesterol checked recently. States she is eating healthy.  She states the family is eating in a healthy manner.  8.  Essential Hypertension:  Misplaced her bp meds.  Just started back today.  Has been off medication for 1 week.    Current Meds  Medication Sig  . AGAMATRIX ULTRA-THIN LANCETS MISC Check sugars before meals twice daily  . atorvastatin (LIPITOR) 20 MG tablet Take 1 tablet (20 mg total) by mouth daily.  . Blood Glucose Monitoring Suppl (AGAMATRIX PRESTO PRO METER) DEVI Check blood sugar twice daily before breakfast and dinner  . cyclobenzaprine (FLEXERIL) 5 MG tablet 1/2 to 1 tab by mouth at bedtime for neck pain  . glucose blood (AGAMATRIX PRESTO TEST) test strip Check sugars twice daily before meals  . lisinopril (PRINIVIL,ZESTRIL) 10 MG tablet Take 1 tablet (10 mg total) by mouth daily.  . metFORMIN (GLUCOPHAGE-XR) 500 MG 24 hr tablet 1 tab  by mouth with meal twice daily  . metoprolol tartrate (LOPRESSOR) 25 MG tablet Take 0.5 tablets (12.5 mg total) by mouth 2 (two) times daily.    No Known Allergies   Past Medical History:  Diagnosis Date  . Diabetes mellitus without complication ([place])   . Hyperlipidemia   . Hypertension   . Obesity (BMI 30.0-34.9)   . Stress 02/21/2017    History reviewed. No pertinent surgical history.   Family History  Problem Relation Age of Onset  . Diabetes Maternal Grandmother   . Cancer Paternal Grandmother        cervical  . Hypertension Sister   . Edema Sister   . Asthma Daughter   . Eczema Daughter     Social History   Socioeconomic History  . Marital status: Soil scientist    Spouse name: [name]  . Number of children: 4  . Years of education: 98  . Highest education level: 12th grade  Social Needs  . Financial resource strain: Not very hard  . Food insecurity - worry: Sometimes true  . Food insecurity - inability: Sometimes true  . Transportation needs - medical: No  . Transportation needs - non-medical: No  Occupational History  . Occupation: Babysit/business cleaning  Tobacco Use  . Smoking status: Never Smoker  . Smokeless tobacco: Never Used  Substance and Sexual Activity  . Alcohol use: No  . Drug use:  No  . Sexual activity: Yes    Birth control/protection: Condom  Other Topics Concern  . Not on file  Social History Narrative   Has been with [name] for 15 years.   They have one child together.   Lives at home with [name] and her 3 youngest children   Her oldest child is in [place].     Patient is already a grandmother.   Review of Systems  Constitutional: Negative for appetite change and fever.  HENT: Positive for hearing loss (Can have problems at times hearing bothe sides). Negative for dental problem.   Eyes:       Had eye exam this morning at [facility] prescription for glasses. No injury from Diabetes per patient  Respiratory: Negative  for shortness of breath.   Cardiovascular: Negative for chest pain (occasional tightness in high right chest), palpitations and leg swelling.  Gastrointestinal: Positive for diarrhea. Negative for abdominal pain, blood in stool and constipation.  Genitourinary: Negative for dysuria, frequency and vaginal discharge.  Musculoskeletal:       Right palmar pain.  Middle and ring finger at base also involved.  Has had the problem since November and December.  Sometimes gets numbness as well.   Neurological: Negative for weakness.  Psychiatric/Behavioral: The patient is not nervous/anxious.        Sometime sad.  She has met with Metta Clines, LCSW.  She would like to counsel with her.       Objective:   Physical Exam  Constitutional: She is oriented to person, place, and time. She appears well-developed and well-nourished.  HENT:  Head: Normocephalic and atraumatic.  Right Ear: Hearing, tympanic membrane, external ear and ear canal normal.  Left Ear: Hearing, tympanic membrane, external ear and ear canal normal.  Nose: Nose normal.  Mouth/Throat: Uvula is midline and oropharynx is clear and moist.  Eyes: Conjunctivae and EOM are normal. Pupils are equal, round, and reactive to light.  Discs sharp bilaterally  Neck: Normal range of motion and full passive range of motion without pain. Neck supple. No thyromegaly present.  Cardiovascular: Normal rate, regular rhythm, S1 normal and S2 normal. Exam reveals no S3, no S4 and no friction rub.  No murmur heard. No carotid bruits.  Carotid, radial, femoral, DP and PT pulses normal and equal.   Pulmonary/Chest: Effort normal and breath sounds normal. Right breast exhibits no inverted nipple, no mass, no nipple discharge, no skin change and no tenderness. Left breast exhibits no inverted nipple, no mass, no nipple discharge, no skin change and no tenderness.  Abdominal: Soft. Bowel sounds are normal. She exhibits no mass. There is no hepatosplenomegaly.  There is no tenderness.  Genitourinary:  Genitourinary Comments: Normal external genitalia.  No uterine or adnexal mass or tenderness.   Rectal:  No mass with good tone.  Heme negative light brown stool.  Musculoskeletal: Normal range of motion.  Lymphadenopathy:       Head (right side): No submental and no submandibular adenopathy present.       Head (left side): No submental and no submandibular adenopathy present.    She has no cervical adenopathy.    She has no axillary adenopathy.       Right: No inguinal and no supraclavicular adenopathy present.       Left: No inguinal and no supraclavicular adenopathy present.  Neurological: She is alert and oriented to person, place, and time. She has normal strength and normal reflexes. No cranial nerve  deficit or sensory deficit. Coordination and gait normal.  + Tinels and Phalens at volar right wrist.  Diabetic Foot Exam - Simple   Simple Foot Form Diabetic Foot exam was performed with the following findings:  Yes  06/22/2017  4:04 PM  Visual Inspection No deformities, no ulcerations, no other skin breakdown bilaterally:  Yes Sensation Testing Intact to touch and monofilament testing bilaterally:  Yes Pulse Check Posterior Tibialis and Dorsalis pulse intact bilaterally:  Yes Comments    Skin: Skin is warm. No rash noted.  Psychiatric: She has a normal mood and affect. Her speech is normal and behavior is normal. Judgment and thought content normal. Cognition and memory are normal.          Assessment & Plan:  1.  CPE without pap. Mammogram to be repeated in June to check for stability of lesion in right breast Guaiac cards to be returned in 2 weeks. Immunizations current Fasting labs in next 2 weeks:  FLP, CMP, HIV To get outside and be physically active daily.  2. Hypertension:  Encouraged to never run out of medication or to refill if unable to find current bottle in the future.  3.  DM:  Microalbumin/crea today.  4.   Obesity:  As in #1 to work on diet as well.  5.  Right Carpal tunnel Syndrome:  Cock up splint at night.  Naproxen or Ibuprofen twice daily for next 2 weeks.  6.  Sadness: referral to Macie Burows, LCSW

## 2017-06-23 LAB — MICROALBUMIN / CREATININE URINE RATIO
CREATININE, UR: 87.2 mg/dL
MICROALBUM., U, RANDOM: 1344.6 ug/mL
Microalb/Creat Ratio: 1542 mg/g creat — ABNORMAL HIGH (ref 0.0–30.0)

## 2017-07-11 ENCOUNTER — Other Ambulatory Visit: Payer: Self-pay

## 2017-07-11 VITALS — BP 132/88 | HR 60

## 2017-07-11 DIAGNOSIS — I10 Essential (primary) hypertension: Secondary | ICD-10-CM

## 2017-07-11 DIAGNOSIS — Z7251 High risk heterosexual behavior: Secondary | ICD-10-CM

## 2017-07-11 DIAGNOSIS — E782 Mixed hyperlipidemia: Secondary | ICD-10-CM

## 2017-07-11 DIAGNOSIS — Z79899 Other long term (current) drug therapy: Secondary | ICD-10-CM

## 2017-07-11 DIAGNOSIS — Z1211 Encounter for screening for malignant neoplasm of colon: Secondary | ICD-10-CM

## 2017-07-11 LAB — POC HEMOCCULT BLD/STL (HOME/3-CARD/SCREEN)
Card #2 Fecal Occult Blod, POC: NEGATIVE
Card #3 Fecal Occult Blood, POC: NEGATIVE
Fecal Occult Blood, POC: NEGATIVE

## 2017-07-11 NOTE — Progress Notes (Signed)
Patient BP in normal range. Informed to continue current dose of medication and make sure she is taking medication daily.

## 2017-07-12 LAB — LIPID PANEL W/O CHOL/HDL RATIO
CHOLESTEROL TOTAL: 135 mg/dL (ref 100–199)
HDL: 54 mg/dL (ref >39)
LDL Calculated: 55 mg/dL (ref 0–99)
TRIGLYCERIDES: 131 mg/dL (ref 0–149)
VLDL Cholesterol Cal: 26 mg/dL (ref 5–40)

## 2017-07-12 LAB — COMPREHENSIVE METABOLIC PANEL
A/G RATIO: 1.2 (ref 1.2–2.2)
ALBUMIN: 3.8 g/dL (ref 3.5–5.5)
ALT: 20 IU/L (ref 0–32)
AST: 15 IU/L (ref 0–40)
Alkaline Phosphatase: 101 IU/L (ref 39–117)
BUN / CREAT RATIO: 8 — AB (ref 9–23)
BUN: 9 mg/dL (ref 6–24)
Bilirubin Total: <0.2 mg/dL (ref 0.0–1.2)
CHLORIDE: 103 mmol/L (ref 96–106)
CO2: 25 mmol/L (ref 20–29)
Calcium: 9.3 mg/dL (ref 8.7–10.2)
Creatinine, Ser: 1.06 mg/dL — ABNORMAL HIGH (ref 0.57–1.00)
GFR, EST AFRICAN AMERICAN: 75 mL/min/{1.73_m2} (ref >59)
GFR, EST NON AFRICAN AMERICAN: 65 mL/min/{1.73_m2} (ref >59)
GLOBULIN, TOTAL: 3.3 g/dL (ref 1.5–4.5)
Glucose: 138 mg/dL — ABNORMAL HIGH (ref 65–99)
POTASSIUM: 4.6 mmol/L (ref 3.5–5.2)
Sodium: 142 mmol/L (ref 134–144)
TOTAL PROTEIN: 7.1 g/dL (ref 6.0–8.5)

## 2017-07-12 LAB — HIV ANTIBODY (ROUTINE TESTING W REFLEX): HIV SCREEN 4TH GENERATION: NONREACTIVE

## 2017-09-26 ENCOUNTER — Other Ambulatory Visit: Payer: Self-pay

## 2017-09-27 ENCOUNTER — Other Ambulatory Visit: Payer: Self-pay

## 2017-09-27 DIAGNOSIS — E119 Type 2 diabetes mellitus without complications: Secondary | ICD-10-CM

## 2017-09-28 ENCOUNTER — Encounter: Payer: Self-pay | Admitting: Internal Medicine

## 2017-09-28 ENCOUNTER — Ambulatory Visit: Payer: Self-pay | Admitting: Internal Medicine

## 2017-09-28 VITALS — BP 138/88 | HR 82 | Resp 12 | Ht 61.5 in | Wt 187.0 lb

## 2017-09-28 DIAGNOSIS — R4589 Other symptoms and signs involving emotional state: Secondary | ICD-10-CM

## 2017-09-28 DIAGNOSIS — I10 Essential (primary) hypertension: Secondary | ICD-10-CM

## 2017-09-28 DIAGNOSIS — E119 Type 2 diabetes mellitus without complications: Secondary | ICD-10-CM

## 2017-09-28 DIAGNOSIS — E669 Obesity, unspecified: Secondary | ICD-10-CM

## 2017-09-28 DIAGNOSIS — R809 Proteinuria, unspecified: Secondary | ICD-10-CM

## 2017-09-28 LAB — HGB A1C W/O EAG: HEMOGLOBIN A1C: 6.8 % — AB (ref 4.8–5.6)

## 2017-09-28 NOTE — Progress Notes (Signed)
   Subjective:    Patient ID: Jennifer Dunn, female    DOB: [birth year], 42 y.o.   MRN: [id number]  HPI    1.  DM:  A1C is back down to 6.Microalbumin/crea was up in March, but she had missed her Lisinopril just prior to checking that lab as well.8%. She has changed her diet and more physically active with work.  She is taking her medication, but sometimes misses still on a rare occasion.  Discussed weight is continuing to increase.  She stopped walking outside of work, but plans to restart.  roved from March.  States checking feet nightly.   2.  Essential Hypertension:  As above.  Her BP is imp 3.  Sadness:  States she is doing fine now.  States she spoke with Macie Burows, LCSW once, but do not see any documentation of that.  4.  Right breast abnormality on mammogram, likely a fibroadenoma.  [facility] her follow up [facility]ogram  Current Meds  Medication Sig  . AGAMATRIX ULTRA-THIN LANCETS MISC Check sugars before meals twice daily  . atorvastatin (LIPITOR) 20 MG tablet Take 1 tablet (20 mg total) by mouth daily.  . Blood Glucose Monitoring Suppl (AGAMATRIX PRESTO PRO METER) DEVI Check blood sugar twice daily before breakfast and dinner  . glucose blood (AGAMATRIX PRESTO TEST) test strip Check sugars twice daily before meals  . lisinopril (PRINIVIL,ZESTRIL) 10 MG tablet Take 1 tablet (10 mg total) by mouth daily.  . metFORMIN (GLUCOPHAGE-XR) 500 MG 24 hr tablet 1 tab by mouth with meal twice daily  . metoprolol tartrate (LOPRESSOR) 25 MG tablet Take 0.5 tablets (12.5 mg total) by mouth 2 (two) times daily.    No Known Allergies    Review of Systems     Objective:   Physical Exam NAD Lungs:  CTA CV:  RRR without murmur or rub.  Radial and DP pulses normal and equal Abd:  S, NT, No HSM or mass, + BS LE:  No edema.       Assessment & Plan:  1.  DM:  Improved control.  To continue to work on her diet and physical activity, the latter  in particular. Influenza vaccine in September--to call  2.  Obesity:  As in #1  3.  Hypertension:  Improved control  4.  Microalbuminuria:  Now taking Lisinopril regularly.  Will recheck urine microalbumin/crea in 4 months with other labs.  5.  Sadness: sounds like this is no longer an issue.  6.  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  Labs in 4 months and with me 2 days later

## 2017-10-12 ENCOUNTER — Ambulatory Visit
Admission: RE | Admit: 2017-10-12 | Discharge: 2017-10-12 | Disposition: A | Discharge status: Home / Self Care | Payer: No Typology Code available for payment source | Source: Ambulatory Visit | Attending: Obstetrics and Gynecology | Admitting: Obstetrics and Gynecology

## 2017-10-12 DIAGNOSIS — N6310 Unspecified lump in the right breast, unspecified quadrant: Secondary | ICD-10-CM

## 2017-10-17 ENCOUNTER — Ambulatory Visit
Admission: RE | Admit: 2017-10-17 | Discharge: 2017-10-17 | Disposition: A | Discharge status: Home / Self Care | Payer: No Typology Code available for payment source | Source: Ambulatory Visit | Attending: Obstetrics and Gynecology | Admitting: Obstetrics and Gynecology

## 2018-01-22 ENCOUNTER — Other Ambulatory Visit: Payer: Self-pay

## 2018-01-23 ENCOUNTER — Encounter: Payer: Self-pay | Admitting: Internal Medicine

## 2018-01-23 ENCOUNTER — Other Ambulatory Visit: Payer: Self-pay

## 2018-01-23 DIAGNOSIS — E782 Mixed hyperlipidemia: Secondary | ICD-10-CM

## 2018-01-23 DIAGNOSIS — E119 Type 2 diabetes mellitus without complications: Secondary | ICD-10-CM

## 2018-01-24 LAB — LIPID PANEL W/O CHOL/HDL RATIO
Cholesterol, Total: 170 mg/dL (ref 100–199)
HDL: 37 mg/dL — ABNORMAL LOW
LDL Calculated: 78 mg/dL (ref 0–99)
Triglycerides: 275 mg/dL — ABNORMAL HIGH (ref 0–149)
VLDL Cholesterol Cal: 55 mg/dL — ABNORMAL HIGH (ref 5–40)

## 2018-01-24 LAB — HGB A1C W/O EAG: Hgb A1c MFr Bld: 7.4 % — ABNORMAL HIGH (ref 4.8–5.6)

## 2018-01-24 LAB — MICROALBUMIN / CREATININE URINE RATIO
Creatinine, Urine: 111.4 mg/dL
Microalb/Creat Ratio: 949.6 mg/g{creat} — ABNORMAL HIGH (ref 0.0–30.0)
Microalbumin, Urine: 1057.8 ug/mL

## 2018-01-25 ENCOUNTER — Ambulatory Visit: Payer: Self-pay | Admitting: Internal Medicine

## 2018-01-25 ENCOUNTER — Encounter: Payer: Self-pay | Admitting: Internal Medicine

## 2018-01-25 VITALS — BP 132/84 | HR 84 | Resp 12 | Ht 61.5 in | Wt 184.0 lb

## 2018-01-25 DIAGNOSIS — E119 Type 2 diabetes mellitus without complications: Secondary | ICD-10-CM

## 2018-01-25 DIAGNOSIS — I10 Essential (primary) hypertension: Secondary | ICD-10-CM

## 2018-01-25 DIAGNOSIS — R809 Proteinuria, unspecified: Secondary | ICD-10-CM

## 2018-01-25 DIAGNOSIS — E782 Mixed hyperlipidemia: Secondary | ICD-10-CM

## 2018-01-25 DIAGNOSIS — E669 Obesity, unspecified: Secondary | ICD-10-CM

## 2018-01-25 LAB — GLUCOSE, POCT (MANUAL RESULT ENTRY): POC Glucose: 203 mg/dl — AB (ref 70–99)

## 2018-01-25 MED ORDER — LISINOPRIL 20 MG PO TABS: ORAL_TABLET | ORAL | 11 refills | Status: DC

## 2018-01-25 NOTE — Patient Instructions (Signed)
Free flu vaccine/covered flu vaccine (bring insurance card if you have one) at flu vaccine clinics:   [facility] card sign up in October 17th for 2 hours  between 8:30 a.m. and 11:30 a.m .

## 2018-01-25 NOTE — Progress Notes (Signed)
   Subjective:    Patient ID: Jennifer Dunn, female    DOB: Jan 21, 1976, 42 y.o.   MRN: [id number]  HPI  1.  DM:  A1C up to 7.4% from 6.8%, microalbumin/crea still too high.  She knows she has not been eating well recently.  Working 2 different jobs and just easier to pick up [place] and pizza.  Her children are eating similarly.   Her husband has CKD from DM and is basically disabled due to DM complications.  2.  Microalbuminuria:  Is taking Lisinopril 10 mg daily.  Current Meds  Medication Sig  . AGAMATRIX ULTRA-THIN LANCETS MISC Check sugars before meals twice daily  . atorvastatin (LIPITOR) 20 MG tablet Take 1 tablet (20 mg total) by mouth daily.  . Blood Glucose Monitoring Suppl (AGAMATRIX PRESTO PRO METER) DEVI Check blood sugar twice daily before breakfast and dinner  . glucose blood (AGAMATRIX PRESTO TEST) test strip Check sugars twice daily before meals  . lisinopril (PRINIVIL,ZESTRIL) 10 MG tablet Take 1 tablet (10 mg total) by mouth daily.  . metFORMIN (GLUCOPHAGE-XR) 500 MG 24 hr tablet 1 tab by mouth with meal twice daily  . metoprolol tartrate (LOPRESSOR) 25 MG tablet Take 0.5 tablets (12.5 mg total) by mouth 2 (two) times daily.   No Known Allergies   Review of Systems     Objective:   Physical Exam NAD Lungs:  CA CV:  RRR without murmur or rub.  Radial and DP pulses normal and equal LE:  No edema       Assessment & Plan:  1.  DM:  To get restarted with her previoiusly good diet and make a goal to never fall off for more than 1-2 days.   She plans to get back to her walking as well.    2.  Microalbuminuria:  Increase Lisinopril to 20 mg daily.   3.  Hypertension:  Continue Metoprolol and increase Lisinopril to 20 mg daily as above.    4.  Hyperlipidemia:  Was at goal with 20 mg Atorvastatin previously.  Has stopped walking.    5.  Social:  Needs family meeting to set rules.  [name] and older teens to help with food preparation. Younger one needs to  not miss the bus. She would prefer SW coming to house for a family meeting to help them assign duties to the family so patient can take better care of herself.  6.  HM:  Will come for free flu vaccine on Oct. 17 as needs to renew [employer]

## 2018-01-29 ENCOUNTER — Ambulatory Visit: Payer: Self-pay | Admitting: Licensed Clinical Social Worker

## 2018-01-29 DIAGNOSIS — F439 Reaction to severe stress, unspecified: Secondary | ICD-10-CM

## 2018-01-29 NOTE — Progress Notes (Signed)
LCSW completed consult with Sophira to identify family dynamics that are contributing to her stress and inability to care for her own health. Macyn shared that the burden of household chores and caretaking fall on her shoulders. She expressed frustration with her husband and children for rarely contributing. She stated that she has tried to get family members to take responsibility in many different ways but that nothing is working. She requested LCSW support in scheduling a family meeting to address household responsibilties. Scheduled for 10/23 at 4:30pm.

## 2018-02-07 ENCOUNTER — Other Ambulatory Visit (INDEPENDENT_AMBULATORY_CARE_PROVIDER_SITE_OTHER): Payer: Self-pay | Admitting: Licensed Clinical Social Worker

## 2018-02-07 DIAGNOSIS — F439 Reaction to severe stress, unspecified: Secondary | ICD-10-CM

## 2018-02-08 NOTE — Progress Notes (Signed)
   THERAPY PROGRESS NOTE  Session Time: 2min  Participation Level: Active  Behavioral Response: CasualAlertEuthymic  Type of Therapy: Individual Therapy  Treatment Goals addressed: Coping  Interventions: Supportive and Family Systems  Summary: Jennifer Dunn is a 42 y.o. female who presents with a euthymic mood and appropriate affect. Her three children and husband Jennifer Dunn also participated in the family session. Jennifer Dunn shared that she is stressed and overwhelmed by the amount of responsibility she has in her life. Family members shared about their current responsibilities and identified one or two chores more that they can commit to doing. Jennifer Dunn stated that he could not do more, as the children should pitch in. He did not appear receptive to LCSW feedback about the importance of each family member's role. Jennifer Dunn shared that Jennifer Dunn does not even ask the kids to do chores because they don't listen to him, so delegating tasks to the kids is another of her responsibilities. Family agreed that they need to change so that everyone is contributing and not allowing Jennifer Dunn to take it all on herself.    Suicidal/Homicidal: Nowithout intent/plan  Therapist Response: LCSW utilized supportive counseling techniques throughout the session in order to validate emotions and encourage open expression of emotion. LCSW asked each family member to identify their current responsibilities and biggest barrier to contributing more. LCSW asked each family member to commit to one more chore that they can do regularly. LCSW led discussion about the consequences of continuing to allow Jennifer Dunn to take on all the family responsibility, especially given the health conditions of both Jennifer Dunn.  Plan: Family to follow chore plan written in session.    Jennifer Clines, LCSW 02/08/2018

## 2018-03-05 ENCOUNTER — Emergency Department (HOSPITAL_COMMUNITY): Payer: Self-pay

## 2018-03-05 ENCOUNTER — Other Ambulatory Visit: Payer: Self-pay

## 2018-03-05 ENCOUNTER — Encounter (HOSPITAL_COMMUNITY): Payer: Self-pay | Admitting: Emergency Medicine

## 2018-03-05 ENCOUNTER — Emergency Department (HOSPITAL_COMMUNITY)
Admission: EM | Admit: 2018-03-05 | Discharge: 2018-03-05 | Disposition: A | Discharge status: Home / Self Care | Payer: Self-pay | Attending: Emergency Medicine | Admitting: Emergency Medicine

## 2018-03-05 ENCOUNTER — Encounter: Payer: Self-pay | Admitting: Internal Medicine

## 2018-03-05 DIAGNOSIS — M542 Cervicalgia: Secondary | ICD-10-CM | POA: Insufficient documentation

## 2018-03-05 DIAGNOSIS — E119 Type 2 diabetes mellitus without complications: Secondary | ICD-10-CM | POA: Insufficient documentation

## 2018-03-05 DIAGNOSIS — I10 Essential (primary) hypertension: Secondary | ICD-10-CM | POA: Insufficient documentation

## 2018-03-05 DIAGNOSIS — R079 Chest pain, unspecified: Secondary | ICD-10-CM | POA: Insufficient documentation

## 2018-03-05 LAB — CBC WITH DIFFERENTIAL/PLATELET
Abs Immature Granulocytes: 0.04 10*3/uL (ref 0.00–0.07)
BASOS ABS: 0 10*3/uL (ref 0.0–0.1)
BASOS PCT: 0 %
Eosinophils Absolute: 0.1 10*3/uL (ref 0.0–0.5)
Eosinophils Relative: 1 %
HCT: 35.9 % — ABNORMAL LOW (ref 36.0–46.0)
HEMOGLOBIN: 10.5 g/dL — AB (ref 12.0–15.0)
Immature Granulocytes: 0 %
LYMPHS PCT: 29 %
Lymphs Abs: 3.1 10*3/uL (ref 0.7–4.0)
MCH: 24.1 pg — AB (ref 26.0–34.0)
MCHC: 29.2 g/dL — ABNORMAL LOW (ref 30.0–36.0)
MCV: 82.3 fL (ref 80.0–100.0)
MONO ABS: 0.7 10*3/uL (ref 0.1–1.0)
Monocytes Relative: 7 %
NRBC: 0 % (ref 0.0–0.2)
Neutro Abs: 6.7 10*3/uL (ref 1.7–7.7)
Neutrophils Relative %: 63 %
PLATELETS: 261 10*3/uL (ref 150–400)
RBC: 4.36 MIL/uL (ref 3.87–5.11)
RDW: 16 % — ABNORMAL HIGH (ref 11.5–15.5)
WBC: 10.7 10*3/uL — AB (ref 4.0–10.5)

## 2018-03-05 LAB — BASIC METABOLIC PANEL
Anion gap: 8 (ref 5–15)
BUN: 22 mg/dL — AB (ref 6–20)
CHLORIDE: 102 mmol/L (ref 98–111)
CO2: 26 mmol/L (ref 22–32)
CREATININE: 1.2 mg/dL — AB (ref 0.44–1.00)
Calcium: 8.6 mg/dL — ABNORMAL LOW (ref 8.9–10.3)
GFR, EST NON AFRICAN AMERICAN: 55 mL/min — AB (ref >60)
Glucose, Bld: 135 mg/dL — ABNORMAL HIGH (ref 70–99)
Potassium: 3.7 mmol/L (ref 3.5–5.1)
SODIUM: 136 mmol/L (ref 135–145)

## 2018-03-05 LAB — I-STAT TROPONIN, ED: TROPONIN I, POC: 0 ng/mL (ref 0.00–0.08)

## 2018-03-05 LAB — I-STAT BETA HCG BLOOD, ED (MC, WL, AP ONLY): I-stat hCG, quantitative: <5 m[IU]/mL (ref <5)

## 2018-03-05 MED ORDER — TRAMADOL HCL 50 MG PO TABS
50.0000 mg | ORAL_TABLET | Freq: Once | ORAL | Status: AC
  Administered 2018-03-05: 50 mg via ORAL
  Filled 2018-03-05: qty 1

## 2018-03-05 MED ORDER — TRAMADOL HCL 50 MG PO TABS: 50.0000 mg | ORAL_TABLET | Freq: Four times a day (QID) | ORAL | 0 refills | Status: DC | PRN

## 2018-03-05 MED ORDER — METHOCARBAMOL 500 MG PO TABS: 500.0000 mg | ORAL_TABLET | Freq: Two times a day (BID) | ORAL | 0 refills | Status: DC

## 2018-03-05 MED ORDER — METHOCARBAMOL 500 MG PO TABS
500.0000 mg | ORAL_TABLET | Freq: Once | ORAL | Status: AC
  Administered 2018-03-05: 500 mg via ORAL
  Filled 2018-03-05: qty 1

## 2018-03-05 NOTE — ED Triage Notes (Signed)
Patient here with a few day history of neck pain that radiates into her upper back.  She also states that she has been having some chest pain, with numbness into right arm.  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  No shortness of breath, no nausea, no vomiting.

## 2018-03-05 NOTE — ED Notes (Signed)
Pt alert and oriented. Verbalized decrease in pain after meds. Ambulatory with steady gait. Denies SOB. No acute distress noted.

## 2018-03-05 NOTE — ED Provider Notes (Signed)
[facility] EMERGENCY DEPARTMENT Provider Note   CSN: [id number] Arrival date & time: 03/05/18  0147     History   Chief Complaint Chief Complaint  Patient presents with  . Neck Pain  . Chest Pain    HPI Jennifer Dunn is a 42 y.o. female.  The history is provided by the patient and medical records.  Neck Pain   Associated symptoms include chest pain.  Chest Pain      42 y.o. F with hx of DM, HLP, HTN, presenting to the ED for chest pain and left sided neck pain.  History obtained via language interpreter as patient speaks minimal english.  Patient reports for the past week she has been having a "pinching" type pain in her left chest.  States it comes and goes, not necessarily associated with exertion.  She denies SOB, palpitations, dizziness, weakness, or feelings of syncope.  She denies any known cardiac history.  He does report she thinks her great grandmother may have had some heart problems but is not entirely sure.  She is not a smoker.  Denies any history of IV drug use.  She denies any history of DVT or PE.  No recent travel, surgery, or prolonged immobilization.  She is not currently on exogenous estrogens.  Patient also reports she developed left-sided neck pain 3 days ago.  States pain in the back of her neck.  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  She denies any radiation of pain down her arm but does report some occasional tingling of her fingers, but this mostly occurs on her right hand.  She does not have any focal numbness or weakness of her arms or legs.  Has not had any bowel or bladder incontinence.  She denies any headache or confusion.  She is not currently on anticoagulation.  She has not tried any medications for his at home.  Patient does report being under some stress that she is the primary caregiver her husband who  has chronic kidney disease and does not get much help from her other family members.  Past Medical History:  Diagnosis Date  . Diabetes mellitus without complication ([place])   . Hyperlipidemia   . Hypertension     There are no active problems to display for this patient.   History reviewed. No pertinent surgical history.   OB History   None      Home Medications    Prior to Admission medications   Not on File    Family History Family History  Problem Relation Age of Onset  . Heart failure Other     Social History Social History   Tobacco Use  . Smoking status: Never Smoker  . Smokeless tobacco: Never Used  Substance Use Topics  . Alcohol use: Never    Frequency: Never  . Drug use: Never     Allergies   Patient has no known allergies.   Review of Systems Review of Systems  Cardiovascular: Positive for chest pain.  Musculoskeletal: Positive for neck pain.  All other systems reviewed and are negative.    Physical Exam Updated Vital Signs BP 115/86   Pulse 79   Resp 13   LMP 03/04/2018   SpO2 97%   Physical Exam  Constitutional: She is oriented to person, place, and time. She appears well-developed  and well-nourished.  HENT:  Head: Normocephalic and atraumatic.  Mouth/Throat: Oropharynx is clear and moist.  Eyes: Pupils are equal, round, and reactive to light. Conjunctivae and EOM are normal.  Neck: Normal range of motion.  Cardiovascular: Normal rate, regular rhythm and normal heart sounds.  Pulmonary/Chest: Effort normal and breath sounds normal. She has no wheezes. She has no rhonchi.  Chest pain is reproducible with palpation of left chest wall no deformity, lungs are clear without wheezes or rhonchi, no rubs or murmur  Abdominal: Soft. Bowel sounds are normal.  Musculoskeletal: Normal range of motion.  Tenderness along left cervical paraspinal musculature extending somewhat into the trapezius, there is some mild spasm noted, no apparent  difficulty with range of motion of the left shoulder, elbow, wrist, normal grip strength bilaterally  Neurological: She is alert and oriented to person, place, and time.  AAOx3, answering questions and following commands appropriately; equal strength UE and LE bilaterally; CN grossly intact; moves all extremities appropriately without ataxia; no focal neuro deficits or facial asymmetry appreciated, ambulatory with steady gait  Skin: Skin is warm and dry.  Psychiatric: She has a normal mood and affect.  Nursing note and vitals reviewed.    ED Treatments / Results  Labs (all labs ordered are listed, but only abnormal results are displayed) Labs Reviewed  CBC WITH DIFFERENTIAL/PLATELET - Abnormal; Notable for the following components:      Result Value   WBC 10.7 (*)    Hemoglobin 10.5 (*)    HCT 35.9 (*)    MCH 24.1 (*)    MCHC 29.2 (*)    RDW 16.0 (*)    All other components within normal limits  BASIC METABOLIC PANEL - Abnormal; Notable for the following components:   Glucose, Bld 135 (*)    BUN 22 (*)    Creatinine, Ser 1.20 (*)    Calcium 8.6 (*)    GFR calc non Af Amer 55 (*)    All other components within normal limits  I-STAT TROPONIN, ED  I-STAT BETA HCG BLOOD, ED ([facility], [facility], [facility] ONLY)    EKG EKG Interpretation  Date/Time:  Monday March 05 2018 02:13:25 EST Ventricular Rate:  76 PR Interval:    QRS Duration: 96 QT Interval:  387 QTC Calculation: 436 R Axis:   -23 Text Interpretation:  Sinus rhythm Borderline left axis deviation No previous tracing Confirmed by Orpah Greek [phone]) on 03/05/2018 2:16:28 AM   Radiology Dg Chest 2 View  Result Date: 03/05/2018 CLINICAL DATA:  Mid chest pain tonight.  Neck pain. EXAM: CHEST - 2 VIEW COMPARISON:  None. FINDINGS: The heart size and mediastinal contours are within normal limits. Both lungs are clear. The visualized skeletal structures are unremarkable. IMPRESSION: No active cardiopulmonary disease.  Electronically Signed   By: Lucienne Capers M.D.   On: 03/05/2018 03:11    Procedures Procedures (including critical care time)  Medications Ordered in ED Medications  methocarbamol (ROBAXIN) tablet 500 mg (500 mg Oral Given 03/05/18 0332)  traMADol (ULTRAM) tablet 50 mg (50 mg Oral Given 03/05/18 0332)     Initial Impression / Assessment and Plan / ED Course  I have reviewed the triage vital signs and the nursing notes.  Pertinent labs & imaging results that were available during my care of the patient were reviewed by me and considered in my medical decision making (see chart for details).  42 year old female here with chest pain and mentally for the past week and neck pain that  began 3 days ago.  She is afebrile and nontoxic in appearance.  She does have some tenderness to the left chest wall and left cervical paraspinal musculature.  There is no deformity or signs of trauma.  She does not have any focal neurologic deficits.  She does report a lot of physical activity as she cleans offices and homes for living.  Pain is worse when having to move around, lift her arms above her head, etc.  She does not have any known cardiac history.  EKG here is nonischemic.  Symptoms seem very likely to be muscular skeletal in nature, however she does have some risk factors.  Will obtain screening labs as well as chest x-ray.  Given medications for pain control.  Will monitor.  Screening labs are overall reassuring, slight bump in serum creatinine.  Chest x-ray is clear.  Patient has had good response to Robaxin and tramadol here in the ER.  Has been able to get up and move around with no apparent difficulty.  Her vitals remained stable.  Continue to feel that this is likely muscular skeletal.  I have lower suspicion for ACS, PE, dissection or other acute cardiac event.  Will discharge home with symptomatic control.  Recommended close follow-up with PCP.  She will return here for any new or worsening  symptoms.  Final Clinical Impressions(s) / ED Diagnoses   Final diagnoses:  Chest pain in adult    ED Discharge Orders         Ordered    methocarbamol (ROBAXIN) 500 MG tablet  2 times daily     03/05/18 0450    traMADol (ULTRAM) 50 MG tablet  Every 6 hours PRN     03/05/18 0450           Larene Pickett, PA-C 03/05/18 0510    Ezequiel Essex, MD 03/05/18 [phone]

## 2018-03-05 NOTE — Discharge Instructions (Addendum)
Take the prescribed medication as directed.  Can use heating pad or warm compresses on your neck to help as well. Follow-up with Dr. Amil Amen in the next week for re-check.  Have her review your labs and chest x-ray from today's visit. Return to the ED for new or worsening symptoms.

## 2018-03-22 ENCOUNTER — Other Ambulatory Visit: Payer: Self-pay

## 2018-03-22 ENCOUNTER — Ambulatory Visit: Payer: Self-pay | Admitting: Internal Medicine

## 2018-03-22 DIAGNOSIS — R7989 Other specified abnormal findings of blood chemistry: Secondary | ICD-10-CM

## 2018-03-23 LAB — BASIC METABOLIC PANEL
BUN/Creatinine Ratio: 20 (ref 9–23)
BUN: 21 mg/dL (ref 6–24)
CO2: 24 mmol/L (ref 20–29)
CREATININE: 1.05 mg/dL — AB (ref 0.57–1.00)
Calcium: 8.5 mg/dL — ABNORMAL LOW (ref 8.7–10.2)
Chloride: 102 mmol/L (ref 96–106)
GFR calc Af Amer: 75 mL/min/{1.73_m2} (ref >59)
GFR, EST NON AFRICAN AMERICAN: 65 mL/min/{1.73_m2} (ref >59)
Glucose: 125 mg/dL — ABNORMAL HIGH (ref 65–99)
Potassium: 3.8 mmol/L (ref 3.5–5.2)
Sodium: 141 mmol/L (ref 134–144)

## 2018-03-28 ENCOUNTER — Encounter: Payer: Self-pay | Admitting: Internal Medicine

## 2018-03-28 ENCOUNTER — Ambulatory Visit: Payer: Self-pay | Admitting: Internal Medicine

## 2018-03-28 VITALS — BP 150/90 | HR 80 | Resp 12 | Ht 61.5 in | Wt 182.0 lb

## 2018-03-28 DIAGNOSIS — E119 Type 2 diabetes mellitus without complications: Secondary | ICD-10-CM

## 2018-03-28 DIAGNOSIS — F439 Reaction to severe stress, unspecified: Secondary | ICD-10-CM

## 2018-03-28 DIAGNOSIS — R7989 Other specified abnormal findings of blood chemistry: Secondary | ICD-10-CM

## 2018-03-28 DIAGNOSIS — D241 Benign neoplasm of right breast: Secondary | ICD-10-CM

## 2018-03-28 DIAGNOSIS — S29011A Strain of muscle and tendon of front wall of thorax, initial encounter: Secondary | ICD-10-CM

## 2018-03-28 DIAGNOSIS — I10 Essential (primary) hypertension: Secondary | ICD-10-CM

## 2018-03-28 LAB — GLUCOSE, POCT (MANUAL RESULT ENTRY): POC GLUCOSE: 145 mg/dL — AB (ref 70–99)
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
## 2018-04-27 ENCOUNTER — Other Ambulatory Visit: Payer: Self-pay

## 2018-05-03 ENCOUNTER — Other Ambulatory Visit: Payer: Self-pay

## 2018-05-03 ENCOUNTER — Ambulatory Visit: Payer: Self-pay | Admitting: Internal Medicine

## 2018-05-03 DIAGNOSIS — E782 Mixed hyperlipidemia: Secondary | ICD-10-CM

## 2018-05-03 DIAGNOSIS — E119 Type 2 diabetes mellitus without complications: Secondary | ICD-10-CM

## 2018-05-04 LAB — MICROALBUMIN / CREATININE URINE RATIO
Creatinine, Urine: 87.3 mg/dL
Microalb/Creat Ratio: 739.9 mg/g creat — ABNORMAL HIGH (ref 0.0–30.0)
Microalbumin, Urine: 645.9 ug/mL

## 2018-05-04 LAB — LIPID PANEL W/O CHOL/HDL RATIO
Cholesterol, Total: 135 mg/dL (ref 100–199)
HDL: 53 mg/dL (ref >39)
LDL CALC: 48 mg/dL (ref 0–99)
TRIGLYCERIDES: 170 mg/dL — AB (ref 0–149)
VLDL CHOLESTEROL CAL: 34 mg/dL (ref 5–40)

## 2018-05-04 LAB — HGB A1C W/O EAG: Hgb A1c MFr Bld: 6.7 % — ABNORMAL HIGH (ref 4.8–5.6)

## 2018-05-10 ENCOUNTER — Encounter: Payer: Self-pay | Admitting: Internal Medicine

## 2018-05-10 ENCOUNTER — Ambulatory Visit: Payer: Self-pay | Admitting: Internal Medicine

## 2018-05-10 VITALS — BP 140/90 | HR 64 | Resp 12 | Ht 61.5 in | Wt 182.0 lb

## 2018-05-10 DIAGNOSIS — I10 Essential (primary) hypertension: Secondary | ICD-10-CM

## 2018-05-10 DIAGNOSIS — E782 Mixed hyperlipidemia: Secondary | ICD-10-CM

## 2018-05-10 DIAGNOSIS — E119 Type 2 diabetes mellitus without complications: Secondary | ICD-10-CM

## 2018-05-10 LAB — GLUCOSE, POCT (MANUAL RESULT ENTRY): POC Glucose: 132 mg/dl — AB (ref 70–99)

## 2018-05-10 NOTE — Patient Instructions (Signed)
Greek yogurt 2-5% con frutas y Muesli(x)

## 2018-05-10 NOTE — Progress Notes (Signed)
    Subjective:    Patient ID: Jennifer Dunn, female   DOB: 06/13/1975, 43 y.o.   MRN: [id number]  1.  Hypertension:  Has not missed bp meds at all since last visit.  Taking Metoprolol 12.5 mg twice daily and Lisinopril 20 mg daily.  2.  DM:  Sugars running high 100s.  160-175 in general.  Today, has had yogurt early this morning.  A1C, however, is quite good at 6.7%  3.  Dyslipidemia:  Much improved with Atorvastatin 20 mg daily.  She is walking regularly in recent weeks.  No problems with Atorvastatin. Her children and Jennifer Dunn are helping her out more.    Current Meds  Medication Sig  . AGAMATRIX ULTRA-THIN LANCETS MISC Check sugars before meals twice daily  . atorvastatin (LIPITOR) 20 MG tablet Take 1 tablet (20 mg total) by mouth daily.  . Blood Glucose Monitoring Suppl (AGAMATRIX PRESTO PRO METER) DEVI Check blood sugar twice daily before breakfast and dinner  . glucose blood (AGAMATRIX PRESTO TEST) test strip Check sugars twice daily before meals  . lisinopril (PRINIVIL,ZESTRIL) 20 MG tablet 1 tab by mouth daily  . metFORMIN (GLUCOPHAGE-XR) 500 MG 24 hr tablet 1 tab by mouth with meal twice daily  . metoprolol tartrate (LOPRESSOR) 25 MG tablet Take 0.5 tablets (12.5 mg total) by mouth 2 (two) times daily.    No Known Allergies   Objective:  Blood pressure 140/90, pulse 64, resp. rate 12, height 5' 1.5" (1.562 m), weight 182 lb (82.6 kg). NAD Lungs:  CTA CV:  RRR without murmur or rub.  Radial pulses normal and equal Abd:  S, NT, No HSM or mass, + BS LE:  No edema   Assessment & Plan  1.  Hypertension:  Increase Metoprolol to 25 mg twice daily with bp and pulse check in 1 week.    2.  DM:  Excellent control.  Discussed remaining diligent with a regular schedule with eating, etc, despite work schedule.  3.  Hyperlipidemia:  At goal save for mild elevation in Trigs.  CPM. CPE with pap in 4 months.

## 2018-05-13 ENCOUNTER — Other Ambulatory Visit: Payer: Self-pay | Admitting: Internal Medicine

## 2018-05-15 ENCOUNTER — Telehealth: Payer: Self-pay | Admitting: Internal Medicine

## 2018-05-15 NOTE — Telephone Encounter (Signed)
rx has been sent to pharmacy

## 2018-05-15 NOTE — Telephone Encounter (Signed)
Patient needs refill of metoprolol tartrate (LOPRESSOR) 25 mg.

## 2018-05-24 ENCOUNTER — Other Ambulatory Visit: Payer: Self-pay | Admitting: Internal Medicine

## 2018-06-06 ENCOUNTER — Other Ambulatory Visit: Payer: Self-pay | Admitting: Internal Medicine

## 2018-07-30 ENCOUNTER — Other Ambulatory Visit: Payer: Self-pay

## 2018-07-30 ENCOUNTER — Other Ambulatory Visit: Payer: Self-pay | Admitting: Internal Medicine

## 2018-07-30 ENCOUNTER — Telehealth: Payer: Self-pay | Admitting: Internal Medicine

## 2018-07-30 NOTE — Telephone Encounter (Signed)
Rx was sent to pharmacy already via auto refill

## 2018-07-30 NOTE — Telephone Encounter (Signed)
Patient requesting Rx on metoprolol tartrate (LOPRESSOR) 25 MG tablet.  Please advise.

## 2018-09-06 ENCOUNTER — Encounter: Payer: Self-pay | Admitting: Internal Medicine

## 2018-10-09 ENCOUNTER — Other Ambulatory Visit (HOSPITAL_COMMUNITY): Payer: Self-pay | Admitting: *Deleted

## 2018-10-09 DIAGNOSIS — D241 Benign neoplasm of right breast: Secondary | ICD-10-CM

## 2018-10-25 ENCOUNTER — Ambulatory Visit: Payer: Self-pay | Admitting: Internal Medicine

## 2018-10-25 ENCOUNTER — Encounter: Payer: Self-pay | Admitting: Internal Medicine

## 2018-10-25 ENCOUNTER — Other Ambulatory Visit: Payer: Self-pay | Admitting: Internal Medicine

## 2018-10-25 ENCOUNTER — Other Ambulatory Visit: Payer: Self-pay

## 2018-10-25 VITALS — BP 138/88 | HR 62 | Temp 98.2°F | Resp 12 | Ht 61.5 in | Wt 192.0 lb

## 2018-10-25 DIAGNOSIS — Z0000 Encounter for general adult medical examination without abnormal findings: Secondary | ICD-10-CM

## 2018-10-25 DIAGNOSIS — I10 Essential (primary) hypertension: Secondary | ICD-10-CM

## 2018-10-25 DIAGNOSIS — E669 Obesity, unspecified: Secondary | ICD-10-CM

## 2018-10-25 DIAGNOSIS — Z124 Encounter for screening for malignant neoplasm of cervix: Secondary | ICD-10-CM

## 2018-10-25 DIAGNOSIS — K029 Dental caries, unspecified: Secondary | ICD-10-CM

## 2018-10-25 DIAGNOSIS — E782 Mixed hyperlipidemia: Secondary | ICD-10-CM

## 2018-10-25 DIAGNOSIS — E119 Type 2 diabetes mellitus without complications: Secondary | ICD-10-CM

## 2018-10-25 MED ORDER — AGAMATRIX ULTRA-THIN LANCETS MISC: 11 refills | Status: AC

## 2018-10-25 MED ORDER — METFORMIN HCL ER 500 MG PO TB24: 500.0000 mg | ORAL_TABLET | Freq: Two times a day (BID) | ORAL | 3 refills | Status: DC

## 2018-10-25 MED ORDER — AGAMATRIX PRESTO TEST VI STRP: ORAL_STRIP | 12 refills | Status: AC

## 2018-10-25 MED ORDER — METOPROLOL TARTRATE 25 MG PO TABS: ORAL_TABLET | ORAL | 3 refills | Status: DC

## 2018-10-25 NOTE — Progress Notes (Signed)
Subjective:    Patient ID: Jennifer Dunn, female   DOB: 24-Aug-1975, 43 y.o.   MRN: [id number]   HPI  CPE with pap  1.  Pap:  Last pap was 01/2016 and normal.  No history of abnormal pap.  Paternal grandmother with history of either uterine or cervical cancer when age 34 yo.  Underwent hysterectomy and survives today.  2.  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  For some reason, did not have follow up bilateral mammogram and right breast ultrasound in January 2020.  Sounds like had different issues and then the pandemic came up.  She has her appt for these next week.  No family history of breast cancer.  3.  Osteoprevention:  Eats cheese daily, maybe twice.  Does not drink much in way of milk--doesn't like.  She has not tried almond milk, but willing to do so.   She is willing to get outside and walk.  They do have a yard, but she does not like to garden.  Apparently her husband gardens.    4.  Guaiac Cards:  Performed last year 06/2017 and negative x 3.  5.  Colonoscopy:  Never.  No family history of colon cancer  6.  Immunizations:   Immunization History  Administered Date(s) Administered  . Influenza Split 01/05/2017  . Influenza,inj,Quad PF,6+ Mos 02/19/2015, 02/05/2018  . Pneumococcal Polysaccharide-23 02/21/2017  . Tdap 02/16/2016     7.  Glucose/Cholesterol:  Last A1C was 6.7% in January.   Cholesterol panel in January was at goal for everything but triglycerides. Ran out of Atorvastatin more than 1 month ago.  States the pharmacy would not fill.  Was sent for an entire year in 05/2018.  Current Meds  Medication Sig  . AGAMATRIX ULTRA-THIN LANCETS MISC Check sugars before meals twice daily  . Blood Glucose Monitoring Suppl (AGAMATRIX PRESTO PRO METER) DEVI Check blood sugar twice daily before breakfast and dinner  . glucose blood (AGAMATRIX PRESTO TEST) test strip Check sugars twice daily before meals  . lisinopril (PRINIVIL,ZESTRIL) 20  MG tablet 1 tab by mouth daily  . metFORMIN (GLUCOPHAGE-XR) 500 MG 24 hr tablet Take 1 tablet by mouth twice daily  . metoprolol tartrate (LOPRESSOR) 25 MG tablet Take 1/2 (one-half) tablet by mouth twice daily   No Known Allergies   Past Medical History:  Diagnosis Date  . Diabetes mellitus without complication ([place])   . Hyperlipidemia   . Hypertension   . Iron deficiency anemia 02/19/2019  . Obesity (BMI 30.0-34.9)   . Stress 02/21/2017    History reviewed. No pertinent surgical history.  Family History  Problem Relation Age of Onset  . Heart failure Other   . Diabetes Maternal Grandmother   . Cancer Paternal Grandmother        cervical  . Hypertension Sister   . Edema Sister   . Asthma Daughter   . Eczema Daughter     Social History   Socioeconomic History  . Marital status: Single    Spouse name: Jennifer Dunn  . Number of children: 4  . Years of education: 68  . Highest education level: 12th grade  Occupational History  . Occupation: Sports administrator  Social Needs  . Financial resource strain: Not very hard  . Food insecurity    Worry: Sometimes true    Inability: Sometimes true  . Transportation needs    Medical: No    Non-medical: No  Tobacco Use  . Smoking status:  Never Smoker  . Smokeless tobacco: Never Used  Substance and Sexual Activity  . Alcohol use: Never    Frequency: Never  . Drug use: Never  . Sexual activity: Yes    Birth control/protection: Condom  Lifestyle  . Physical activity    Days per week: 0 days    Minutes per session: 0 min  . Stress: Only a little  Relationships  . Social connections    Talks on phone: More than three times a week    Gets together: Once a week    Attends religious service: 1 to 4 times per year    Active member of club or organization: No    Attends meetings of clubs or organizations: Never    Relationship status: Living with partner  . Intimate partner violence    Fear of current or ex partner: No     Emotionally abused: No    Physically abused: No    Forced sexual activity: No  Other Topics Concern  . Not on file  Social History Narrative   Has been with Jennifer Dunn, a patient here, since 2003   They have one child together.   Lives at home with Jennifer Dunn and her 3 youngest children   Her oldest child is in [place].     Patient is already a grandmother.      Review of Systems  Constitutional: Negative for appetite change, fatigue and fever.  HENT: Positive for dental problem (one tooth looks like it is moving out of place). Negative for hearing loss and sore throat.   Eyes: Negative for visual disturbance (Wears glasses.  Last eye check was 2 years ago.  ).  Respiratory: Negative for cough and shortness of breath.   Cardiovascular: Positive for leg swelling (Feet swell in the afternoons when standing or sitting a lot.  Does not really watch salt intake.  ). Negative for chest pain and palpitations.  Gastrointestinal: Negative for abdominal pain, blood in stool (No melena), constipation and diarrhea.  Genitourinary: Negative for dysuria and menstrual problem (No period for 4 months.  Comes and goes over past year.  She is having hot flashes.).  Musculoskeletal: Positive for arthralgias (Legs and knees hurt.  Also with plantar pain of heel of right foot.  Wears flip flops a lot.  When goes out, tennis shoes.  ).  Skin: Negative for rash.  Neurological: Negative for weakness and numbness.  Psychiatric/Behavioral: Negative for dysphoric mood. The patient is not nervous/anxious (Stressed with her husband's illnesses and care and work.  Doing okay with food access and other SDOH.).       Objective:   BP 138/88 (BP Location: Right Arm, Patient Position: Sitting, Cuff Size: Large)   Pulse 62   Temp 98.2 F (36.8 C)   Resp 12   Ht 5' 1.5" (1.562 m)   Wt 192 lb (87.1 kg)   BMI 35.69 kg/m   Physical Exam  Constitutional: She is oriented to person, place, and time. She appears well-developed  and well-nourished.  HENT:  Head: Normocephalic and atraumatic.  Right Ear: Hearing, tympanic membrane, external ear and ear canal normal.  Left Ear: Hearing, tympanic membrane, external ear and ear canal normal.  Nose: Nose normal.  Mouth/Throat: Uvula is midline, oropharynx is clear and moist and mucous membranes are normal.  Dental decay with loose teeth  Eyes: Pupils are equal, round, and reactive to light. Conjunctivae and EOM are normal.  Discs sharp  Neck: Normal range of motion and full  passive range of motion without pain. Neck supple. No thyromegaly present.  Cardiovascular: Normal rate, regular rhythm, S1 normal and S2 normal. Exam reveals no S3 and no S4.  No murmur heard. No carotid bruits.  Carotid, radial, femoral, DP and PT pulses normal and equal.  Varicosities of bilateral LE throughout.  Mild to moderate  Pulmonary/Chest: Effort normal and breath sounds normal. Right breast exhibits no inverted nipple, no mass, no nipple discharge, no skin change and no tenderness. Left breast exhibits no inverted nipple, no mass, no nipple discharge, no skin change and no tenderness.  Abdominal: Soft. Bowel sounds are normal. She exhibits no mass. There is no hepatosplenomegaly. There is no abdominal tenderness. No hernia.  Genitourinary:    Genitourinary Comments: Normal external female genitalia. No cervical or vaginal lesions. No adnexal mass or tenderness Rectal:  No mass, Heme negative light brown stool.    Musculoskeletal: Normal range of motion.     Comments: Diabetic foot exam was performed with the following findings:   No deformities, ulcerations, or other skin breakdown Normal sensation of 10g monofilament Intact posterior tibialis and dorsalis pedis pulses Point tenderness over right plantar heel.     Lymphadenopathy:       Head (right side): No submental and no submandibular adenopathy present.       Head (left side): No submental and no submandibular adenopathy  present.    She has no cervical adenopathy.    She has no axillary adenopathy.       Right: No inguinal and no supraclavicular adenopathy present.       Left: No inguinal and no supraclavicular adenopathy present.  Neurological: She is alert and oriented to person, place, and time. She has normal strength and normal reflexes. No cranial nerve deficit or sensory deficit. Coordination and gait normal.  Skin: Skin is warm. No rash noted.  Lots of skin tags in skin folds of axilla and about base of neck.  Psychiatric: She has a normal mood and affect. Her speech is normal and behavior is normal. Judgment and thought content normal.      Assessment & Plan  1.  CPE with pap Mammogram for follow up of probable adenoma next week. Guaiac cards x 3 to return in 2 weeks Return in 2 months for fasting labs:  FLP, A1C, CMP, CBC, urine microalbumin/crea and with me in 4 months. To call for influenza vaccine in September/October  2.  DM:  Was controlled in January.  Not clear if she is taking meds regularly. Referral for eye exam.    3.  Hyperlipidemia:  Has not picked up Atorvastatin since 01/2018.  [facility] has the Rx from February refill.  Encouraged her never to miss.  4.  Hypertension:  Would like to see bp a bit lower in the 120/70 range.  Suspect not taking meds regularly.  Meds refilled  5.  Dental decay:  Dental referral.

## 2018-10-29 LAB — CYTOLOGY - PAP

## 2018-10-30 ENCOUNTER — Ambulatory Visit
Admission: RE | Admit: 2018-10-30 | Discharge: 2018-10-30 | Disposition: A | Discharge status: Home / Self Care | Payer: No Typology Code available for payment source | Source: Ambulatory Visit | Attending: Obstetrics and Gynecology | Admitting: Obstetrics and Gynecology

## 2018-10-30 ENCOUNTER — Other Ambulatory Visit (HOSPITAL_COMMUNITY): Payer: Self-pay | Admitting: Obstetrics and Gynecology

## 2018-10-30 ENCOUNTER — Ambulatory Visit (HOSPITAL_COMMUNITY)
Admission: RE | Admit: 2018-10-30 | Discharge: 2018-10-30 | Disposition: A | Discharge status: Home / Self Care | Payer: Self-pay | Source: Ambulatory Visit | Attending: Obstetrics and Gynecology | Admitting: Obstetrics and Gynecology

## 2018-10-30 ENCOUNTER — Other Ambulatory Visit: Payer: Self-pay

## 2018-10-30 ENCOUNTER — Encounter (HOSPITAL_COMMUNITY): Payer: Self-pay

## 2018-10-30 ENCOUNTER — Ambulatory Visit
Admission: RE | Admit: 2018-10-30 | Discharge: 2018-10-30 | Disposition: A | Discharge status: Home / Self Care | Payer: Self-pay | Source: Ambulatory Visit | Attending: Obstetrics and Gynecology | Admitting: Obstetrics and Gynecology

## 2018-10-30 DIAGNOSIS — D241 Benign neoplasm of right breast: Secondary | ICD-10-CM

## 2018-10-30 DIAGNOSIS — N6310 Unspecified lump in the right breast, unspecified quadrant: Secondary | ICD-10-CM

## 2018-10-30 DIAGNOSIS — Z1239 Encounter for other screening for malignant neoplasm of breast: Secondary | ICD-10-CM | POA: Insufficient documentation

## 2018-10-30 DIAGNOSIS — N644 Mastodynia: Secondary | ICD-10-CM | POA: Insufficient documentation

## 2018-10-30 NOTE — Patient Instructions (Signed)
Explained breast self  Jennifer Dunn. Patient did not need a Pap smear today due to last Pap smear was 10/25/2018.  Let her know B[facility]CP will cover Pap smears every 3 years unless has a history of abnormal Pap smears. Referred patient to the [facility] for a diagnostic mammogram. Appointment scheduled for Tuesday, October 30, 2018 at 1520. Patient aware of appointment and will be there. Jennifer Dunn verbalized understanding.  Lelani Garnett, Arvil Chaco, RN 2:42 PM

## 2018-10-30 NOTE — Progress Notes (Signed)
Complaints of left lower breast pain x 3 years that comes and goes. Patient rates the pain at a 7 out of 10. Patient complained of a left breast lump x 2 months that comes and goes.   Pap Smear: Pap smear not completed today. Last Pap smear was 10/25/2018 at [facility] and normal. Per patient has no history of an abnormal Pap smear. Last Pap smear result is in Epic.  Physical exam: Breasts Breasts symmetrical. No skin abnormalities bilateral breasts. No nipple retraction bilateral breasts. No nipple discharge bilateral breasts. No lymphadenopathy. No lumps palpated bilateral breasts. Unable to palpate a lump in patients area of concern. Complaints of left outer breast tenderness on exam. Referred patient to the [facility] for a diagnostic mammogram. Appointment scheduled for Tuesday, October 30, 2018 at 1520.        Pelvic/Bimanual No Pap smear completed today since last Pap smear was 10/25/2018. Pap smear not indicated per BCCCP guidelines.   Smoking History: Patient has never smoked.  Patient Navigation: Patient education provided. Access to services provided for patient through B[facility]CP program.   Breast and Cervical Cancer Risk Assessment: Patient has no family history of breast cancer, known genetic mutations, or radiation treatment to the chest before age 1. Patient has no history of cervical dysplasia, immunocompromised, or DES exposure in-utero.  Risk Assessment    Risk Scores      10/30/2018   Last edited by: Armond Hang, LPN   5-year risk: 0.5 %   Lifetime risk: 6.2 %

## 2018-12-27 ENCOUNTER — Other Ambulatory Visit: Payer: Self-pay

## 2018-12-27 DIAGNOSIS — Z79899 Other long term (current) drug therapy: Secondary | ICD-10-CM

## 2018-12-27 DIAGNOSIS — E119 Type 2 diabetes mellitus without complications: Secondary | ICD-10-CM

## 2018-12-27 DIAGNOSIS — E782 Mixed hyperlipidemia: Secondary | ICD-10-CM

## 2018-12-28 LAB — CBC WITH DIFFERENTIAL/PLATELET
Basophils Absolute: 0 10*3/uL (ref 0.0–0.2)
Basos: 1 %
EOS (ABSOLUTE): 0.1 10*3/uL (ref 0.0–0.4)
Eos: 2 %
Hematocrit: 32.9 % — ABNORMAL LOW (ref 34.0–46.6)
Hemoglobin: 10.3 g/dL — ABNORMAL LOW (ref 11.1–15.9)
Immature Grans (Abs): 0 10*3/uL (ref 0.0–0.1)
Immature Granulocytes: 0 %
Lymphocytes Absolute: 1.9 10*3/uL (ref 0.7–3.1)
Lymphs: 26 %
MCH: 26 pg — ABNORMAL LOW (ref 26.6–33.0)
MCHC: 31.3 g/dL — ABNORMAL LOW (ref 31.5–35.7)
MCV: 83 fL (ref 79–97)
Monocytes Absolute: 0.7 10*3/uL (ref 0.1–0.9)
Monocytes: 9 %
Neutrophils Absolute: 4.6 10*3/uL (ref 1.4–7.0)
Neutrophils: 62 %
Platelets: 225 10*3/uL (ref 150–450)
RBC: 3.96 x10E6/uL (ref 3.77–5.28)
RDW: 16.5 % — ABNORMAL HIGH (ref 11.7–15.4)
WBC: 7.3 10*3/uL (ref 3.4–10.8)

## 2018-12-28 LAB — COMPREHENSIVE METABOLIC PANEL
ALT: 17 IU/L (ref 0–32)
AST: 17 IU/L (ref 0–40)
Albumin/Globulin Ratio: 1.4 (ref 1.2–2.2)
Albumin: 3.6 g/dL — ABNORMAL LOW (ref 3.8–4.8)
Alkaline Phosphatase: 92 IU/L (ref 39–117)
BUN/Creatinine Ratio: 22 (ref 9–23)
BUN: 24 mg/dL (ref 6–24)
Bilirubin Total: <0.2 mg/dL (ref 0.0–1.2)
CO2: 24 mmol/L (ref 20–29)
Calcium: 8.5 mg/dL — ABNORMAL LOW (ref 8.7–10.2)
Chloride: 105 mmol/L (ref 96–106)
Creatinine, Ser: 1.07 mg/dL — ABNORMAL HIGH (ref 0.57–1.00)
GFR calc Af Amer: 73 mL/min/{1.73_m2} (ref >59)
GFR calc non Af Amer: 63 mL/min/{1.73_m2} (ref >59)
Globulin, Total: 2.5 g/dL (ref 1.5–4.5)
Glucose: 133 mg/dL — ABNORMAL HIGH (ref 65–99)
Potassium: 4.4 mmol/L (ref 3.5–5.2)
Sodium: 139 mmol/L (ref 134–144)
Total Protein: 6.1 g/dL (ref 6.0–8.5)

## 2018-12-28 LAB — LIPID PANEL W/O CHOL/HDL RATIO
Cholesterol, Total: 122 mg/dL (ref 100–199)
HDL: 51 mg/dL (ref >39)
LDL Chol Calc (NIH): 44 mg/dL (ref 0–99)
Triglycerides: 165 mg/dL — ABNORMAL HIGH (ref 0–149)
VLDL Cholesterol Cal: 27 mg/dL (ref 5–40)

## 2018-12-28 LAB — HGB A1C W/O EAG: Hgb A1c MFr Bld: 6.8 % — ABNORMAL HIGH (ref 4.8–5.6)

## 2018-12-28 LAB — MICROALBUMIN / CREATININE URINE RATIO
Creatinine, Urine: 63.5 mg/dL
Microalb/Creat Ratio: 751 mg/g creat — ABNORMAL HIGH (ref 0–29)
Microalbumin, Urine: 476.8 ug/mL

## 2019-01-21 ENCOUNTER — Other Ambulatory Visit: Payer: Self-pay

## 2019-01-21 DIAGNOSIS — D509 Iron deficiency anemia, unspecified: Secondary | ICD-10-CM

## 2019-01-21 DIAGNOSIS — D649 Anemia, unspecified: Secondary | ICD-10-CM

## 2019-01-22 LAB — IRON AND TIBC
Iron Saturation: 8 % — CL (ref 15–55)
Iron: 27 ug/dL (ref 27–159)
Total Iron Binding Capacity: 338 ug/dL (ref 250–450)
UIBC: 311 ug/dL (ref 131–425)

## 2019-02-19 DIAGNOSIS — D509 Iron deficiency anemia, unspecified: Secondary | ICD-10-CM

## 2019-02-19 HISTORY — DX: Iron deficiency anemia, unspecified: D50.9

## 2019-02-19 MED ORDER — FERROUS GLUCONATE 324 (38 FE) MG PO TABS: ORAL_TABLET | ORAL | 3 refills | Status: DC

## 2019-02-19 NOTE — Addendum Note (Signed)
Addended by: Marcelino Duster on: 02/19/2019 04:30 PM   Modules accepted: Orders

## 2019-02-22 ENCOUNTER — Telehealth: Payer: Self-pay | Admitting: Internal Medicine

## 2019-02-22 MED ORDER — LISINOPRIL 20 MG PO TABS: ORAL_TABLET | ORAL | 9 refills | Status: DC

## 2019-02-22 NOTE — Telephone Encounter (Signed)
Pt informed

## 2019-02-22 NOTE — Telephone Encounter (Signed)
Please inform patient Rx was sent to [facility] on [address]

## 2019-02-22 NOTE — Telephone Encounter (Signed)
ERROR

## 2019-02-22 NOTE — Telephone Encounter (Signed)
Pt. Called requesting medication refill for lisinopril (PRINIVIL,ZESTRIL) 20 MG tablet [id number] to be sent to [facility] on [address] and [address].

## 2019-02-25 ENCOUNTER — Other Ambulatory Visit: Payer: Self-pay

## 2019-02-25 ENCOUNTER — Ambulatory Visit (INDEPENDENT_AMBULATORY_CARE_PROVIDER_SITE_OTHER): Payer: Self-pay | Admitting: Internal Medicine

## 2019-02-25 ENCOUNTER — Encounter: Payer: Self-pay | Admitting: Internal Medicine

## 2019-02-25 VITALS — BP 170/120 | HR 72 | Resp 12 | Ht 61.5 in | Wt 199.0 lb

## 2019-02-25 DIAGNOSIS — M722 Plantar fascial fibromatosis: Secondary | ICD-10-CM

## 2019-02-25 DIAGNOSIS — I10 Essential (primary) hypertension: Secondary | ICD-10-CM

## 2019-02-25 DIAGNOSIS — D509 Iron deficiency anemia, unspecified: Secondary | ICD-10-CM

## 2019-02-25 DIAGNOSIS — E782 Mixed hyperlipidemia: Secondary | ICD-10-CM

## 2019-02-25 DIAGNOSIS — Z23 Encounter for immunization: Secondary | ICD-10-CM

## 2019-02-25 DIAGNOSIS — E119 Type 2 diabetes mellitus without complications: Secondary | ICD-10-CM

## 2019-02-25 DIAGNOSIS — M542 Cervicalgia: Secondary | ICD-10-CM

## 2019-02-25 LAB — GLUCOSE, POCT (MANUAL RESULT ENTRY): POC Glucose: 157 mg/dl — AB (ref 70–99)

## 2019-02-25 MED ORDER — MELOXICAM 7.5 MG PO TABS: ORAL_TABLET | ORAL | 1 refills | Status: DC

## 2019-02-25 NOTE — Progress Notes (Signed)
Subjective:    Patient ID: Jennifer Dunn, female   DOB: 1975-11-11, 43 y.o.   MRN: [id number]   HPI   [place] and not clear if we obtained correct info as reportedly has not filled most of her meds until last Friday since July.  She is not clear if she is picking up 90 or 30 day supplies as well.  1. Hypertension:  Was without her medication--sounds like only her Lisinopril for a week and just got restarted last week.  Of note, her microalbuminuria was up a bit in September.    2.  DM:  Has been missing her Metformin as well as she states she ran out and then her husband did not pick up end of last week.   A1C back in September was in good control at 6.8% Checking her feet nightly.  Having right heel pain.  Wearing flip flops.   Wears tennis shoes for work.  On hard floors all day.   Has an eye appt end of this month at [facility]--Dr. Frederico Hamman.  3.  Iron Deficiency Anemia:  Stopped taking Ferrous gluconate 2 months ago.  She just did not go to refill OTC.  4.  Hyperlipidemia:  Cholesterol panel at goal with Atorvastatin save for mildly elevated Triglycerides.  Lipid Panel     Component Value Date/Time   CHOL 122 12/27/2018 0920   TRIG 165 (H) 12/27/2018 0920   HDL 51 12/27/2018 0920   CHOLHDL 2.9 01/07/2016 0921   VLDL 33 (H) 01/07/2016 0921   LDLCALC 44 12/27/2018 0920   LABVLDL 27 12/27/2018 0920     5.  HM:  Needs flu vaccine  6.  Right shoulder and upper back as well as her neck with pain.  Very stressed.  Always sweeping and mopping floors--cleans a [facility].    Current Meds  Medication Sig  . AgaMatrix Ultra-Thin Lancets MISC Check sugars before meals twice daily  . atorvastatin (LIPITOR) 20 MG tablet TAKE 1 TABLET BY MOUTH ONCE DAILY  . Blood Glucose Monitoring Suppl (AGAMATRIX PRESTO PRO METER) DEVI Check blood sugar twice daily before breakfast and dinner  . glucose blood (AGAMATRIX PRESTO TEST) test strip Check sugars  twice daily before meals  . lisinopril (ZESTRIL) 20 MG tablet 1 tab by mouth daily  . metFORMIN (GLUCOPHAGE-XR) 500 MG 24 hr tablet Take 1 tablet (500 mg total) by mouth 2 (two) times daily.  . metoprolol tartrate (LOPRESSOR) 25 MG tablet Take 1/2 (one-half) tablet by mouth twice daily  . Multiple Vitamin (MULTIVITAMIN) tablet Take 1 tablet by mouth daily.   No Known Allergies   Review of Systems    Objective:   BP (!) 170/120 (BP Location: Left Arm, Patient Position: Sitting, Cuff Size: Normal)   Pulse 72   Resp 12   Ht 5' 1.5" (1.562 m)   Wt 199 lb (90.3 kg)   LMP 01/11/2019 (Approximate)   BMI 36.99 kg/m   Physical Exam   Neck:  Supple, No adenopathy, No thyromegaly Chest:  CTA CV:  RRR without murmur or rub.  Radial pulses normal and equal.   MS:  Tender over cervical spinous processes and paraspinous musculature as well as traps bilaterally.  Neuro:  Motor of UE:  2+/4 throughout LE:  No edema.  Tender over plantar heel surface of right heel.   Diabetic foot exam was performed with the following findings:   Normal sensation of 10g monofilament Intact posterior tibialis and  dorsalis pedis pulses Blackened lesion with overlying callus on distal pad of right great toe.  Nontender.    Assessment & Plan   1.  Hypertension:  Poorly controlled.  Again, encouraged her to never run out of medication and to always have the person picking up her medications take her med list from discharge papers with them to the pharmacy to check each off. Discussed again her CKD and worsening if she does not keep her DM and hypertension under control  2.  DM:  A1C was fine last check.  Again, missing Metformin at least in last week if not longer. Eye exam end of month  3.  Hyperlipidemia:  Almost at goal last check.  Trigs only thing a bit high in September  4.  Neck and upper back pain:  PT and Meloxicam  5.  Right plantar fasciitis:  Meloxicam, heel cups, stretches, PT, weight loss.   Limit NSAIDS with CKD.  6.  HM: influenza vaccine.

## 2019-02-25 NOTE — Patient Instructions (Signed)
Tome un vaso de agua antes de cada comida Tome un minimo de 6 a 8 vasos de agua diarios Coma tres veces al dia Coma una proteina y Ardelia Mems grasa saludable con comida.  (huevos, pescado, pollo, pavo, y limite carnes rojas Coma 5 porciones diarias de legumbres.  Mezcle los colores Coma 2 porciones diarias de frutas con cascara cuando sea comestible Use platos pequeos Suelte su tenedor o cuchara despues de cada mordida hata que se mastique y se trague Come en la mesa con amigos o familiares por lo menos una vez al dia Apague la televisin y aparatos electrnicos durante la comida  Su objetivo debe ser perder una libra por semana  Estudios recientes indican que las personas quienes consumen todos de sus calorias durante 12 horas se bajan de pesocon Mas eficiencia.  Por ejemplo, si Usted come su primera comida a las 7:00 a.m., su comida final del dia se debe completar antes de las 7:00 p.m.  Go to [employer] or [employer] and get heel cups for both feet. No going barefoot--always with good thick support of shoes at home and work. Fill your medication regularly and take your med list into the pharmacy each time to make sure you have your meds.   Do not wait until you are out of your meds to refill

## 2019-03-27 ENCOUNTER — Other Ambulatory Visit: Payer: Self-pay

## 2019-05-07 ENCOUNTER — Ambulatory Visit
Admission: RE | Admit: 2019-05-07 | Discharge: 2019-05-07 | Disposition: A | Discharge status: Home / Self Care | Payer: No Typology Code available for payment source | Source: Ambulatory Visit | Attending: Obstetrics and Gynecology | Admitting: Obstetrics and Gynecology

## 2019-05-07 ENCOUNTER — Other Ambulatory Visit: Payer: Self-pay

## 2019-05-07 DIAGNOSIS — N6310 Unspecified lump in the right breast, unspecified quadrant: Secondary | ICD-10-CM

## 2019-06-01 ENCOUNTER — Emergency Department (HOSPITAL_COMMUNITY): Payer: No Typology Code available for payment source

## 2019-06-01 ENCOUNTER — Encounter (HOSPITAL_COMMUNITY): Payer: Self-pay

## 2019-06-01 ENCOUNTER — Emergency Department (HOSPITAL_COMMUNITY)
Admission: EM | Admit: 2019-06-01 | Discharge: 2019-06-02 | Disposition: A | Discharge status: Home / Self Care | Payer: No Typology Code available for payment source | Attending: Emergency Medicine | Admitting: Emergency Medicine

## 2019-06-01 DIAGNOSIS — I10 Essential (primary) hypertension: Secondary | ICD-10-CM

## 2019-06-01 DIAGNOSIS — Z7984 Long term (current) use of oral hypoglycemic drugs: Secondary | ICD-10-CM | POA: Insufficient documentation

## 2019-06-01 DIAGNOSIS — Z79899 Other long term (current) drug therapy: Secondary | ICD-10-CM | POA: Diagnosis not present

## 2019-06-01 DIAGNOSIS — E119 Type 2 diabetes mellitus without complications: Secondary | ICD-10-CM | POA: Diagnosis not present

## 2019-06-01 DIAGNOSIS — M79602 Pain in left arm: Secondary | ICD-10-CM

## 2019-06-01 DIAGNOSIS — R519 Headache, unspecified: Secondary | ICD-10-CM | POA: Diagnosis present

## 2019-06-01 DIAGNOSIS — M79605 Pain in left leg: Secondary | ICD-10-CM | POA: Diagnosis not present

## 2019-06-01 LAB — I-STAT BETA HCG BLOOD, ED (MC, WL, AP ONLY): I-stat hCG, quantitative: <5 m[IU]/mL (ref <5)

## 2019-06-01 MED ORDER — IBUPROFEN 400 MG PO TABS
600.0000 mg | ORAL_TABLET | Freq: Once | ORAL | Status: AC
  Administered 2019-06-01: 600 mg via ORAL
  Filled 2019-06-01: qty 1

## 2019-06-01 MED ORDER — METHOCARBAMOL 500 MG PO TABS: 500.0000 mg | ORAL_TABLET | Freq: Three times a day (TID) | ORAL | 0 refills | Status: DC | PRN

## 2019-06-01 NOTE — Discharge Instructions (Addendum)
Ibuprofen 600mg  (3 pills) every 6 hours Tylenol 1,000 mg (2 pills) every 8 hours  You are being prescribed a medication which may make you sleepy. For 24 hours after one dose please do not drive, operate heavy machinery, care for a small child with out another adult present, or perform any activities that may cause harm to you or someone else if you were to fall asleep or be impaired.   Tennis Must google translate: Le estn recetando un medicamento que puede causarle sueo. Durante las 24 horas posteriores a la administracin de [place] dosis, no conduzca, opere maquinaria pesada, no cuide a un nio pequeo sin la presencia de otro adulto ni realice ninguna actividad que pueda causarle dao a usted oa otra persona si se queda dormido o se ve afectado.
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
## 2019-06-25 ENCOUNTER — Other Ambulatory Visit: Payer: Self-pay

## 2019-06-28 ENCOUNTER — Ambulatory Visit: Payer: Self-pay | Admitting: Internal Medicine

## 2019-07-09 ENCOUNTER — Other Ambulatory Visit: Payer: Self-pay

## 2019-07-31 ENCOUNTER — Other Ambulatory Visit: Payer: Self-pay | Admitting: Internal Medicine

## 2019-08-23 ENCOUNTER — Telehealth: Payer: Self-pay | Admitting: Internal Medicine

## 2019-08-23 NOTE — Telephone Encounter (Signed)
Called pharmacy medication sent in on 08/01/19 patient never picked up. They will get Rx ready patient notified per [facility]

## 2019-08-23 NOTE — Telephone Encounter (Signed)
Patient called Requesting Rx on atorvastatin (LIPITOR) 20 MG tablet to be called in at the default pharmacy. Patient stated has been out of it x two weeks. Called patient back to advise will get refill just for 30 days with no more Rx will be authorized until set up appointments for labs and to see Dr. Amil Amen. Left detailed vm.

## 2019-10-20 ENCOUNTER — Other Ambulatory Visit: Payer: Self-pay | Admitting: Internal Medicine

## 2019-12-06 ENCOUNTER — Other Ambulatory Visit: Payer: Self-pay

## 2019-12-06 ENCOUNTER — Ambulatory Visit (INDEPENDENT_AMBULATORY_CARE_PROVIDER_SITE_OTHER): Payer: Self-pay

## 2019-12-06 VITALS — Wt 199.0 lb

## 2019-12-06 DIAGNOSIS — Z23 Encounter for immunization: Secondary | ICD-10-CM

## 2019-12-06 NOTE — Progress Notes (Signed)
   Covid-19 Vaccination Clinic  Name:  Jennifer Dunn    MRN: [id number] DOB: 1975/12/27  12/06/2019  [name] was observed post Covid-19 immunization for 15 minutes without incident. She was provided with Vaccine Information Sheet and instruction to access the V-Safe system.   [name] was instructed to call 911 with any severe reactions post vaccine: Marland Kitchen Difficulty breathing  . Swelling of face and throat  . A fast heartbeat  . A bad rash all over body  . Dizziness and weakness   Immunizations Administered    Name Date Dose VIS Date Route   Pfizer COVID-19 Vaccine 12/06/2019  9:26 AM 0.3 mL 06/12/2018 Intramuscular   Manufacturer: [employer]   Lot: [id number]   [place]: [id number]

## 2019-12-17 ENCOUNTER — Other Ambulatory Visit: Payer: Self-pay | Admitting: Internal Medicine

## 2019-12-19 ENCOUNTER — Telehealth: Payer: Self-pay | Admitting: Internal Medicine

## 2019-12-19 ENCOUNTER — Ambulatory Visit (INDEPENDENT_AMBULATORY_CARE_PROVIDER_SITE_OTHER): Payer: Self-pay | Admitting: Internal Medicine

## 2019-12-19 DIAGNOSIS — U071 COVID-19: Secondary | ICD-10-CM

## 2019-12-19 DIAGNOSIS — J208 Acute bronchitis due to other specified organisms: Secondary | ICD-10-CM

## 2019-12-19 MED ORDER — FLUTICASONE-SALMETEROL 100-50 MCG/DOSE IN AEPB: INHALATION_SPRAY | RESPIRATORY_TRACT | 1 refills | Status: DC

## 2019-12-19 MED ORDER — AZITHROMYCIN 500 MG PO TABS: ORAL_TABLET | ORAL | 0 refills | Status: DC

## 2019-12-19 NOTE — Telephone Encounter (Signed)
Called patient to inform azithromycin (ZITHROMAX) 500 MG tablet and Fluticasone-Salmeterol (ADVAIR DISKUS) 100-50 MCG/DOSE AEPB being sent to [facility] and [facility].  Unable to get on hold with patient. No voicemail set up yet.

## 2019-12-19 NOTE — Progress Notes (Signed)
    Subjective:    Patient ID: Jennifer Dunn, female   DOB: 1975-05-09, 44 y.o.   MRN: [id number]   HPI   Patient here to be evaluated for second infection with COVID19.  Family with COVID in 07/2018 and husband hospitalized with pneumonia then.  Husband recently released from the hospital also with a second illness with COVID.  She started with symptoms of body aches and fever 6 days ago and subsequently tested positive at [facility] that day.  Five days ago, developed cough and loss of smell and taste.  She is no longer experiencing body aches and fever, but her cough productive of yellow sputum continues to be a problem.  Has developed low anterior mid chest pain with coughing.   States her daughter has 2 new albuterol MDIs, one of which she has been using.  They are not sharing the same inhaler.  No outpatient medications have been marked as taking for the 12/19/19 encounter (Appointment) with Mack Hook, MD.   No Known Allergies   Review of Systems    Objective:   There were no vitals taken for this visit.  Physical Exam  Patient evaluated in her car in parking lot. Appears mildly ill Deep cough, but respirations appear comfortable. Lungs:  Scattered crackles and occasional end expiratory wheeze. CV:  RRR without murmur or rub.     Assessment & Plan   COVID bronchitis/ pneumonia:  Azithromycin 500 mg daily for 5 days, to continue Albuterol HFA 2 puffs every 4-6 hours as needed.   Advair 100/50 1 inhalation twice daily.  Not clear she will be able to afford.  Encouraged her to get signed up for orange card so we can get these inhaler prescriptions for her less expensively in the future. Able to send message to [facility] to see about her receiving MAB as well on Friday, 12/20/2019

## 2019-12-20 ENCOUNTER — Telehealth: Payer: Self-pay | Admitting: Adult Health

## 2019-12-20 NOTE — Telephone Encounter (Signed)
Called patient about covid positive with spanish interpreter to discuss monoclonal antibody infusion to reduce the risk of hospitalization and/or complications from the virus.  She did not answer and her voice mail was not yet set up.  Wilber Bihari, NP

## 2019-12-27 ENCOUNTER — Ambulatory Visit: Payer: Self-pay

## 2020-01-18 ENCOUNTER — Other Ambulatory Visit: Payer: Self-pay

## 2020-01-18 ENCOUNTER — Ambulatory Visit (INDEPENDENT_AMBULATORY_CARE_PROVIDER_SITE_OTHER): Payer: Self-pay

## 2020-01-18 DIAGNOSIS — Z23 Encounter for immunization: Secondary | ICD-10-CM

## 2020-03-02 ENCOUNTER — Other Ambulatory Visit: Payer: Self-pay | Admitting: Internal Medicine

## 2020-03-03 ENCOUNTER — Telehealth: Payer: Self-pay | Admitting: Internal Medicine

## 2020-03-03 NOTE — Telephone Encounter (Signed)
Needs fasting labs prior to follow up in next 2-3 months:  FLP, CBC, CMP, urine microalbumin/crea, A1C.  AFter fills of meds today, no further refills until follows up.

## 2020-03-04 NOTE — Telephone Encounter (Signed)
Patient was notified of her refills and appointment for fasting lab was schedule for 06/04/2020 @ 9:30AM

## 2020-05-25 ENCOUNTER — Other Ambulatory Visit: Payer: Self-pay | Admitting: Internal Medicine

## 2020-06-04 ENCOUNTER — Other Ambulatory Visit: Payer: Self-pay

## 2020-06-22 ENCOUNTER — Other Ambulatory Visit: Payer: Self-pay

## 2020-06-22 ENCOUNTER — Other Ambulatory Visit (INDEPENDENT_AMBULATORY_CARE_PROVIDER_SITE_OTHER): Payer: Self-pay | Admitting: Internal Medicine

## 2020-06-22 DIAGNOSIS — E782 Mixed hyperlipidemia: Secondary | ICD-10-CM

## 2020-06-22 DIAGNOSIS — D509 Iron deficiency anemia, unspecified: Secondary | ICD-10-CM

## 2020-06-22 DIAGNOSIS — Z79899 Other long term (current) drug therapy: Secondary | ICD-10-CM

## 2020-06-22 DIAGNOSIS — E119 Type 2 diabetes mellitus without complications: Secondary | ICD-10-CM

## 2020-06-23 LAB — LIPID PANEL W/O CHOL/HDL RATIO
Cholesterol, Total: 163 mg/dL (ref 100–199)
HDL: 58 mg/dL (ref >39)
LDL Chol Calc (NIH): 72 mg/dL (ref 0–99)
Triglycerides: 198 mg/dL — ABNORMAL HIGH (ref 0–149)
VLDL Cholesterol Cal: 33 mg/dL (ref 5–40)

## 2020-06-23 LAB — CBC WITH DIFFERENTIAL/PLATELET
Basophils Absolute: 0 10*3/uL (ref 0.0–0.2)
Basos: 1 %
EOS (ABSOLUTE): 0.1 10*3/uL (ref 0.0–0.4)
Eos: 1 %
Hematocrit: 41.1 % (ref 34.0–46.6)
Hemoglobin: 12.8 g/dL (ref 11.1–15.9)
Immature Grans (Abs): 0 10*3/uL (ref 0.0–0.1)
Immature Granulocytes: 0 %
Lymphocytes Absolute: 1.6 10*3/uL (ref 0.7–3.1)
Lymphs: 25 %
MCH: 26 pg — ABNORMAL LOW (ref 26.6–33.0)
MCHC: 31.1 g/dL — ABNORMAL LOW (ref 31.5–35.7)
MCV: 84 fL (ref 79–97)
Monocytes Absolute: 0.3 10*3/uL (ref 0.1–0.9)
Monocytes: 5 %
Neutrophils Absolute: 4.4 10*3/uL (ref 1.4–7.0)
Neutrophils: 68 %
Platelets: 226 10*3/uL (ref 150–450)
RBC: 4.92 x10E6/uL (ref 3.77–5.28)
RDW: 16.1 % — ABNORMAL HIGH (ref 11.7–15.4)
WBC: 6.5 10*3/uL (ref 3.4–10.8)

## 2020-06-23 LAB — MICROALBUMIN / CREATININE URINE RATIO
Creatinine, Urine: 27.1 mg/dL
Microalb/Creat Ratio: 2114 mg/g creat — ABNORMAL HIGH (ref 0–29)
Microalbumin, Urine: 573 ug/mL

## 2020-06-23 LAB — COMPREHENSIVE METABOLIC PANEL
ALT: 48 IU/L — ABNORMAL HIGH (ref 0–32)
AST: 43 IU/L — ABNORMAL HIGH (ref 0–40)
Albumin/Globulin Ratio: 1.3 (ref 1.2–2.2)
Albumin: 4.4 g/dL (ref 3.8–4.8)
Alkaline Phosphatase: 126 IU/L — ABNORMAL HIGH (ref 44–121)
BUN/Creatinine Ratio: 14 (ref 9–23)
BUN: 16 mg/dL (ref 6–24)
Bilirubin Total: 0.3 mg/dL (ref 0.0–1.2)
CO2: 24 mmol/L (ref 20–29)
Calcium: 9.6 mg/dL (ref 8.7–10.2)
Chloride: 101 mmol/L (ref 96–106)
Creatinine, Ser: 1.13 mg/dL — ABNORMAL HIGH (ref 0.57–1.00)
Globulin, Total: 3.3 g/dL (ref 1.5–4.5)
Glucose: 196 mg/dL — ABNORMAL HIGH (ref 65–99)
Potassium: 4.5 mmol/L (ref 3.5–5.2)
Sodium: 141 mmol/L (ref 134–144)
Total Protein: 7.7 g/dL (ref 6.0–8.5)
eGFR: 61 mL/min/{1.73_m2} (ref >59)

## 2020-06-23 LAB — HGB A1C W/O EAG: Hgb A1c MFr Bld: 7.6 % — ABNORMAL HIGH (ref 4.8–5.6)

## 2020-06-24 ENCOUNTER — Other Ambulatory Visit: Payer: Self-pay | Admitting: Obstetrics and Gynecology

## 2020-06-24 ENCOUNTER — Encounter: Payer: Self-pay | Admitting: Internal Medicine

## 2020-06-24 ENCOUNTER — Other Ambulatory Visit: Payer: Self-pay

## 2020-06-24 ENCOUNTER — Ambulatory Visit (INDEPENDENT_AMBULATORY_CARE_PROVIDER_SITE_OTHER): Payer: Self-pay | Admitting: Internal Medicine

## 2020-06-24 VITALS — BP 146/82 | HR 62 | Resp 12 | Ht 61.5 in | Wt 186.5 lb

## 2020-06-24 DIAGNOSIS — E782 Mixed hyperlipidemia: Secondary | ICD-10-CM

## 2020-06-24 DIAGNOSIS — Z1231 Encounter for screening mammogram for malignant neoplasm of breast: Secondary | ICD-10-CM

## 2020-06-24 DIAGNOSIS — E1129 Type 2 diabetes mellitus with other diabetic kidney complication: Secondary | ICD-10-CM

## 2020-06-24 DIAGNOSIS — Z23 Encounter for immunization: Secondary | ICD-10-CM

## 2020-06-24 DIAGNOSIS — R809 Proteinuria, unspecified: Secondary | ICD-10-CM

## 2020-06-24 DIAGNOSIS — I10 Essential (primary) hypertension: Secondary | ICD-10-CM

## 2020-06-24 DIAGNOSIS — E118 Type 2 diabetes mellitus with unspecified complications: Secondary | ICD-10-CM

## 2020-06-24 LAB — GLUCOSE, POCT (MANUAL RESULT ENTRY): POC Glucose: 192 mg/dl — AB (ref 70–99)

## 2020-06-24 MED ORDER — METFORMIN HCL ER 500 MG PO TB24: 500.0000 mg | ORAL_TABLET | Freq: Two times a day (BID) | ORAL | 3 refills | Status: DC

## 2020-06-24 MED ORDER — LISINOPRIL 30 MG PO TABS: ORAL_TABLET | ORAL | 3 refills | Status: DC

## 2020-06-24 MED ORDER — ATORVASTATIN CALCIUM 20 MG PO TABS: 20.0000 mg | ORAL_TABLET | Freq: Every day | ORAL | 3 refills | Status: DC

## 2020-06-24 MED ORDER — METOPROLOL TARTRATE 25 MG PO TABS: ORAL_TABLET | ORAL | 3 refills | Status: DC

## 2020-06-24 NOTE — Progress Notes (Signed)
Subjective:    Patient ID: Jennifer Dunn, female   DOB: 08/20/1975, 45 y.o.   MRN: [id number]   HPI   Interpreted by Leeann Must.  1.  Hypertension:  One week ago, was at the dentist where bp was quite high.  She states she was nervous when she heard the cost of the treatment--cleaning, extraction, fillings were all planned.  Because of her bp, they did not complete the work.   Denies missing her Lisinopril or Metoprolol, but not clear if that is just for last month.   Later, admits she went prolonged length of time without her meds in 2021 and without watching her diet.    She has not been seen for her chronic health issues since 02/2019.  Have discussed this at length in past.   Gives many reasons why she is not following up.    Limited to increasing Metoprolol as HR in low 60s resting.  2.  DM:  A1C recently 7.6% up from 6.8% when last here for DM in 2020.  Continues Metformin.  3.  Hyperlipidemia:  LDL and triglycerides not quite at goal.  Has started working on diet and physical activity in January again.  Taking her Atorvastatin.  Lipid Panel     Component Value Date/Time   CHOL 163 06/22/2020 1047   TRIG 198 (H) 06/22/2020 1047   HDL 58 06/22/2020 1047   CHOLHDL 2.9 01/07/2016 0921   VLDL 33 (H) 01/07/2016 0921   LDLCALC 72 06/22/2020 1047   LABVLDL 33 06/22/2020 1047    4.  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  Creatinine up just at bit to 1.13.  5.  Elevated transaminases:  Likely due to fatty liver.  6.  HM:  Did not get her influenza vaccine this year.  Has not had her COVID booster.       Current Meds  Medication Sig  . AgaMatrix Ultra-Thin Lancets MISC Check sugars before meals twice daily  . atorvastatin (LIPITOR) 20 MG tablet Take 1 tablet by mouth once daily  . Blood Glucose Monitoring Suppl (AGAMATRIX PRESTO PRO METER) DEVI Check blood sugar twice daily before breakfast and dinner  . glucose blood (AGAMATRIX PRESTO  TEST) test strip Check sugars twice daily before meals  . lisinopril (ZESTRIL) 20 MG tablet Take 1 tablet by mouth once daily  . metFORMIN (GLUCOPHAGE-XR) 500 MG 24 hr tablet Take 1 tablet by mouth twice daily  . metoprolol tartrate (LOPRESSOR) 25 MG tablet Take 1/2 (one-half) tablet by mouth twice daily  . Multiple Vitamin (MULTIVITAMIN) tablet Take 1 tablet by mouth daily.   No Known Allergies   Review of Systems    Objective:   BP (!) 146/82 (BP Location: Left Arm, Patient Position: Sitting, Cuff Size: Large)   Pulse 62 Comment: With digital monitor  Resp 12   Ht 5' 1.5" (1.562 m)   Wt 186 lb 8 oz (84.6 kg)   BMI 34.67 kg/m   Physical Exam NAD HEENT;  PERRL, EOMI Neck:  Supple, No adenoapthy Chest:  CTA CV:  RRR with normal S1 and S2, No S3, S4 or murmur.  No carotid bruits.  Carotid, radial and DP pulses normal and equal Abd: S + BS LE:  No edema.  Assessment & Plan   1.  Hypertension:  Suspect degree of elevation last week due to anxiety, but not well controlled.  Increase Lisinopril to 30 mg daily.  Continue Metoprolol.   BMP  with BP check in 1 month.  2.  DM with microalbuminuria:  Not quite at goal.  Another long discussion regarding need to follow up and care for herself to prevent more rapid progression of diabetic complications.   She should have been out of a number of medications for some time if she was taking as written.    3.  Hyperlipidemia:  Not quite at goal.  Medication and follow up issues as above.  Continue Atorvastatin.  4.  Obesity:  To continue to work on diet and physical activity.  5.  HM:  Pfizer booster today.

## 2020-07-27 ENCOUNTER — Other Ambulatory Visit: Payer: Self-pay | Admitting: Internal Medicine

## 2020-07-27 ENCOUNTER — Other Ambulatory Visit: Payer: Self-pay

## 2020-07-27 VITALS — BP 140/90

## 2020-07-27 DIAGNOSIS — E119 Type 2 diabetes mellitus without complications: Secondary | ICD-10-CM

## 2020-07-27 DIAGNOSIS — I10 Essential (primary) hypertension: Secondary | ICD-10-CM

## 2020-07-27 NOTE — Progress Notes (Signed)
Here for BP check and CMP.    BP not at goal, so will have her return for repeat bp check in 1 month.  She did increase Lisinopril to 30 mg and continues on Metoprolol as well.

## 2020-07-28 LAB — COMPREHENSIVE METABOLIC PANEL
ALT: 89 IU/L — ABNORMAL HIGH (ref 0–32)
AST: 73 IU/L — ABNORMAL HIGH (ref 0–40)
Albumin/Globulin Ratio: 1.3 (ref 1.2–2.2)
Albumin: 3.7 g/dL — ABNORMAL LOW (ref 3.8–4.8)
Alkaline Phosphatase: 141 IU/L — ABNORMAL HIGH (ref 44–121)
BUN/Creatinine Ratio: 17 (ref 9–23)
BUN: 20 mg/dL (ref 6–24)
Bilirubin Total: <0.2 mg/dL (ref 0.0–1.2)
CO2: 25 mmol/L (ref 20–29)
Calcium: 8.8 mg/dL (ref 8.7–10.2)
Chloride: 99 mmol/L (ref 96–106)
Creatinine, Ser: 1.15 mg/dL — ABNORMAL HIGH (ref 0.57–1.00)
Globulin, Total: 2.9 g/dL (ref 1.5–4.5)
Glucose: 300 mg/dL — ABNORMAL HIGH (ref 65–99)
Potassium: 4.8 mmol/L (ref 3.5–5.2)
Sodium: 134 mmol/L (ref 134–144)
Total Protein: 6.6 g/dL (ref 6.0–8.5)
eGFR: 60 mL/min/{1.73_m2} (ref >59)

## 2020-08-24 ENCOUNTER — Encounter: Payer: Self-pay | Admitting: Internal Medicine

## 2020-09-07 ENCOUNTER — Encounter: Payer: Self-pay | Admitting: Internal Medicine

## 2020-09-08 ENCOUNTER — Ambulatory Visit: Payer: Self-pay | Admitting: Internal Medicine

## 2020-09-08 ENCOUNTER — Other Ambulatory Visit: Payer: Self-pay

## 2020-09-08 VITALS — BP 150/98 | HR 76

## 2020-09-08 DIAGNOSIS — I10 Essential (primary) hypertension: Secondary | ICD-10-CM

## 2020-09-08 NOTE — Progress Notes (Signed)
Patient is taking Lisinopril 30MG  tablet daily and Metoprolol 25MG  1/2 tablet by mouth twice daily.  Patient blood pressure is high today. She thought she was having labs today and did not take her medication. Patient informed she is able to take her medication with water even if she is coming in for labs

## 2020-11-12 ENCOUNTER — Ambulatory Visit: Payer: Self-pay | Admitting: *Deleted

## 2020-11-12 ENCOUNTER — Ambulatory Visit
Admission: RE | Admit: 2020-11-12 | Discharge: 2020-11-12 | Disposition: A | Discharge status: Home / Self Care | Payer: No Typology Code available for payment source | Source: Ambulatory Visit | Attending: Obstetrics and Gynecology | Admitting: Obstetrics and Gynecology

## 2020-11-12 ENCOUNTER — Other Ambulatory Visit: Payer: Self-pay

## 2020-11-12 VITALS — BP 130/88 | Wt 187.5 lb

## 2020-11-12 DIAGNOSIS — Z1239 Encounter for other screening for malignant neoplasm of breast: Secondary | ICD-10-CM

## 2020-11-12 DIAGNOSIS — Z1231 Encounter for screening mammogram for malignant neoplasm of breast: Secondary | ICD-10-CM

## 2020-11-12 NOTE — Patient Instructions (Signed)
Explained breast self awareness with Jennifer Dunn. Patient did not need a Pap smear today due to last Pap smear was 10/25/2018. Let her know [facility] will cover Pap smears every 3 years unless has a history of abnormal Pap smears. Referred patient to the [facility] for a screening mammogram on the mobile unit. Appointment scheduled Thursday, November 12, 2020 at 1100. Patient escorted to the mobile unit following [facility] appointment for her screening mammogram. Let [facility] know the [facility] will follow up with her within the next couple weeks with results of mammogram by letter or phone. Jennifer Dunn verbalized understanding.  Havard Radigan, Arvil Chaco, RN 10:56 AM

## 2020-11-12 NOTE — Progress Notes (Signed)
[name] is a 45 y.o. female who presents to [facility] clinic today with complaint of left breast lump x 3 months that is consistent with previous lump that patient stated she had on exam 10/30/2018. Patient states the lump comes and goes during her menstrual period. Patient complained of right shoulder pain that radiates to her breast x 3 months. Patient rates the pain at a 6-7 out of 10.    Pap Smear: Pap smear not completed today. Last Pap smear was 10/25/2018 at [facility] clinic and was normal. Per patient has no history of an abnormal Pap smear. Last Pap smear result is available in Epic.  Physical exam: Breasts Breasts symmetrical. No skin abnormalities bilateral breasts. No nipple retraction bilateral breasts. No nipple discharge bilateral breasts. No lymphadenopathy. No lumps palpated bilateral breasts. Unable to palpate a lump in patients area of concern within the left breast. No complaints of pain or tenderness on exam.  MS DIGITAL SCREENING BILATERAL  Result Date: 03/31/2017 CLINICAL DATA:  Screening. EXAM: DIGITAL SCREENING BILATERAL MAMMOGRAM WITH CAD COMPARISON:  Previous exam(s). ACR Breast Density Category c: The breast tissue is heterogeneously dense, which may obscure small masses. FINDINGS: In the right breast, a possible asymmetry warrants further evaluation. In the left breast, no findings suspicious for malignancy. Images were processed with CAD. IMPRESSION: Further evaluation is suggested for possible asymmetry in the right breast. RECOMMENDATION: Diagnostic mammogram and possibly ultrasound of the right breast. (Code:[id number]-30M) The patient will be contacted regarding the findings, and additional imaging will be scheduled. BI-RADS CATEGORY  0: Incomplete. Need additional imaging evaluation and/or prior mammograms for comparison. Electronically Signed   By: Kristopher Oppenheim M.D.   On: 03/31/2017 07:56   MM DIAG BREAST TOMO UNI RIGHT  Result Date: 04/14/2017 CLINICAL  DATA:  Patient recalled from screening for right breast asymmetry. EXAM: 2D DIGITAL DIAGNOSTIC RIGHT MAMMOGRAM WITH CAD AND ADJUNCT TOMO ULTRASOUND RIGHT BREAST COMPARISON:  Previous exam(s). ACR Breast Density Category b: There are scattered areas of fibroglandular density. FINDINGS: Persistent masslike asymmetry is demonstrated within the upper-inner right breast on additional imaging. Mammographic images were processed with CAD. On physical exam, I palpate no discrete mass within the upper-inner right breast. Targeted ultrasound is performed, showing a 1.5 x 0.5 x 2.1 cm oval circumscribed hypoechoic mass right breast 1 o'clock position 3 cm from the nipple. IMPRESSION: Probably benign right breast mass, favored to represent a fibroadenoma. RECOMMENDATION: Right breast ultrasound in 6 months to ensure stability of probably benign right breast mass. I have discussed the findings and recommendations with the patient. Results were also provided in writing at the conclusion of the visit. If applicable, a reminder letter will be sent to the patient regarding the next appointment. BI-RADS CATEGORY  3: Probably benign. Electronically Signed   By: Lovey Newcomer M.D.   On: 04/14/2017 15:18   MS DIGITAL DIAG TOMO BILAT  Result Date: 10/30/2018 CLINICAL DATA:  Follow-up for a probably benign right breast mass. EXAM: DIGITAL DIAGNOSTIC BILATERAL MAMMOGRAM WITH CAD AND TOMO ULTRASOUND RIGHT BREAST COMPARISON:  Previous exam(s). ACR Breast Density Category c: The breast tissue is heterogeneously dense, which may obscure small masses. FINDINGS: The mass in the central right breast is stable. There is a new possible obscured mass in the lateral posterior aspect of the right breast. No other suspicious calcifications, masses or areas of distortion are seen in the bilateral breasts. Mammographic images were processed with CAD. Ultrasound of the right breast at 1 o'clock, 3 cm  from the nipple demonstrates a stable oval hypoechoic  mass measuring 2.2 x 0.5 x 1.4 cm, previously measuring 2.0 x 0.5 x 1.4 cm. Ultrasound of the right breast at 9 o'clock, 2 cm from the nipple demonstrates a circumscribed oval mass measuring 1.0 x 0.2 x 0.8 cm, with multiple cystic spaces favored to represent fibrocystic change. IMPRESSION: 1. The probably benign mass in the right breast at 1 o'clock is stable. 2. There is a new mass in the right breast at 9 o'clock, which is likely benign, favored to represent a cluster of cysts. 3.  No mammographic evidence of malignancy in the bilateral breasts. RECOMMENDATION: Six-month follow-up right breast ultrasound is recommended to follow up the 2 masses in the right breast. This will complete the 2 year follow-up for the mass at 1 o'clock, and will be the first six-month follow-up for the mass at 9 o'clock. I have discussed the findings and recommendations with the patient. Results were also provided in writing at the conclusion of the visit. If applicable, a reminder letter will be sent to the patient regarding the next appointment. BI-RADS CATEGORY  3: Probably benign. Electronically Signed   By: Ammie Ferrier M.D.   On: 10/30/2018 16:21        Pelvic/Bimanual Pap is not indicated today per BCCCP guidelines.   Smoking History: Patient has never smoked.   Patient Navigation: Patient education provided. Access to services provided for patient through [facility] program. Spanish interpreter Jennifer Dunn from [facility] provided. Patient has food insecurities. Patient referred to the food market at the [facility] for [employer].  Colorectal Cancer Screening: Per patient has never had colonoscopy completed. No complaints today.    Breast and Cervical Cancer Risk Assessment: Patient does not have family history of breast cancer, known genetic mutations, or radiation treatment to the chest before age 3. Patient does not have history of cervical dysplasia, immunocompromised, or DES exposure  in-utero.  Risk Assessment     Risk Scores       11/12/2020 10/30/2018   Last edited by: Demetrius Revel, LPN Rolena Infante H, LPN   5-year risk: 0.5 % 0.5 %   Lifetime risk: 6 % 6.2 %            A: BCCCP exam without pap smear Complaint of right breast lump that comes and goes  P: Referred patient to the [facility] for a screening mammogram on the mobile unit. Appointment scheduled Thursday, November 12, 2020 at 1100.  Loletta Parish, RN 11/12/2020 10:56 AM

## 2021-01-28 ENCOUNTER — Telehealth: Payer: Self-pay

## 2021-01-28 NOTE — Telephone Encounter (Signed)
Pt called to report reoccurring lower back pain and fatigue. Also feels that she is urinating less frequent. Pain has stayed the same and is mostly on right side. Not taking Rx for this issue

## 2021-02-02 ENCOUNTER — Ambulatory Visit: Payer: Self-pay | Admitting: Internal Medicine

## 2021-02-02 ENCOUNTER — Encounter: Payer: Self-pay | Admitting: Internal Medicine

## 2021-02-02 ENCOUNTER — Other Ambulatory Visit: Payer: Self-pay

## 2021-02-02 VITALS — BP 162/94 | HR 64 | Resp 16 | Ht 61.5 in | Wt 187.0 lb

## 2021-02-02 DIAGNOSIS — E118 Type 2 diabetes mellitus with unspecified complications: Secondary | ICD-10-CM

## 2021-02-02 DIAGNOSIS — D509 Iron deficiency anemia, unspecified: Secondary | ICD-10-CM

## 2021-02-02 DIAGNOSIS — E1129 Type 2 diabetes mellitus with other diabetic kidney complication: Secondary | ICD-10-CM

## 2021-02-02 DIAGNOSIS — Z1159 Encounter for screening for other viral diseases: Secondary | ICD-10-CM

## 2021-02-02 DIAGNOSIS — I10 Essential (primary) hypertension: Secondary | ICD-10-CM

## 2021-02-02 DIAGNOSIS — M545 Low back pain, unspecified: Secondary | ICD-10-CM

## 2021-02-02 DIAGNOSIS — E782 Mixed hyperlipidemia: Secondary | ICD-10-CM

## 2021-02-02 DIAGNOSIS — Z23 Encounter for immunization: Secondary | ICD-10-CM

## 2021-02-02 DIAGNOSIS — G8929 Other chronic pain: Secondary | ICD-10-CM

## 2021-02-02 DIAGNOSIS — R809 Proteinuria, unspecified: Secondary | ICD-10-CM

## 2021-02-02 LAB — GLUCOSE, POCT (MANUAL RESULT ENTRY): POC Glucose: 242 mg/dl — AB (ref 70–99)

## 2021-02-02 MED ORDER — ACETAMINOPHEN 500 MG PO TABS: ORAL_TABLET | ORAL | 0 refills | Status: DC

## 2021-02-02 NOTE — Progress Notes (Signed)
Subjective:    Patient ID: Jennifer Dunn, female   DOB: May 06, 1975, 45 y.o.   MRN: [id number]   HPI  Interpreted by Karen Kays.   Hypertension:  admits to missing her medication for 3-4 days in past 3 weeks.  Did not take this morning as she did not eat this morning.   2.  Back pain:  For 3 months has had pain in her lower back.  Worse on her right side.  Pain comes and goes.   No definitive acute injury. Pain comes on suddenly.  She felt drinking soda was setting it off, so stopped.  She was drinking regular Coke.  States she felt better.   When she is mopping and moving side to side, she has more pain.  Wears a lumbar support belt with improvement.  When bends over to pick something up, also sets off the pain.    3.  Foaming urine:  patient with history of significant proteinuria in past from poorly controlled DM and hypertension.    No dysuria.  4.  DM:  Control was fair in the spring, but patient not with a good diet again.    5.  HM:  did not come in for influenza vaccine clinic yesterday.    Current Meds  Medication Sig   AgaMatrix Ultra-Thin Lancets MISC Check sugars before meals twice daily (Patient taking differently: Check sugars before meals once daily)   atorvastatin (LIPITOR) 20 MG tablet Take 1 tablet (20 mg total) by mouth daily.   Blood Glucose Monitoring Suppl (AGAMATRIX PRESTO PRO METER) DEVI Check blood sugar twice daily before breakfast and dinner   glucose blood (AGAMATRIX PRESTO TEST) test strip Check sugars twice daily before meals (Patient taking differently: Check sugars once daily before meals)   lisinopril (ZESTRIL) 30 MG tablet 1 tab by mouth daily   metFORMIN (GLUCOPHAGE-XR) 500 MG 24 hr tablet Take 1 tablet (500 mg total) by mouth 2 (two) times daily.   metoprolol tartrate (LOPRESSOR) 25 MG tablet 1/2 tab by mouth twice daily.   Multiple Vitamin (MULTIVITAMIN) tablet Take 1 tablet by mouth daily.   No Known Allergies   Review of  Systems    Objective:   BP (!) 162/94 (BP Location: Right Arm, Patient Position: Sitting, Cuff Size: Normal)   Pulse 64   Resp 16   Ht 5' 1.5" (1.562 m)   Wt 187 lb (84.8 kg)   LMP 10/03/2020 (Within Months)   BMI 34.76 kg/m   Physical Exam  NAD Poor posture, hunched forward with neck hyperextended HEENT:  PERRL, EOMI Neck:  Supple, No adenopathy Chest:  CTA CV:  RRR with normal S1 and S2, No S3, S4 or murmur.  No carotid bruits.  Carotid, radial and DP pulses normal and equal.  No LE edema Abd:  S, Diffuse mild tenderness without peritoneal signs or rebound. No HSM or mass, + BS  No flank tenderness. MS:  tender over cervical spinous processes and mid lumbar spinous processes.  Mild tenderness of sacral right paraspinous musculature and minimal at same level on left.  Negative straight leg raise. Neuro/LE:  Motor 5/5 and DTRs 2+/4 throughout.  Gait normal    Assessment & Plan    Hypertension:  missing meds.  Another discussion on finding ways to care for herself to prevent further kidney damage/complications of poorly controlled hypertension and DM.  2.  Low back pain (and neck pain as well):  discussed posture and core strengthening.  Referrral to  [place] pro bono PT clinic.  Tylenol as needed.  Continue to avoid NSAIDS.  3.  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  Suspect worsening due to poorly controlled bp and DM.    4.  DM:  A1C today.  Needs to go to [facility] for eye exam--referral paper given.  5.  Hyperlipidemia:  FLP, CMP.  6.  HM:  to call in 1 week to see if influenza vaccines are in.  [place] bivalent booster today.  CBC, CMP, A1C, urine microalbumin/crea, Hep C screen.

## 2021-02-03 LAB — COMPREHENSIVE METABOLIC PANEL
ALT: 38 IU/L — ABNORMAL HIGH (ref 0–32)
AST: 36 IU/L (ref 0–40)
Albumin/Globulin Ratio: 1.1 — ABNORMAL LOW (ref 1.2–2.2)
Albumin: 3.9 g/dL (ref 3.8–4.8)
Alkaline Phosphatase: 117 IU/L (ref 44–121)
BUN/Creatinine Ratio: 13 (ref 9–23)
BUN: 18 mg/dL (ref 6–24)
Bilirubin Total: <0.2 mg/dL (ref 0.0–1.2)
CO2: 24 mmol/L (ref 20–29)
Calcium: 9.1 mg/dL (ref 8.7–10.2)
Chloride: 102 mmol/L (ref 96–106)
Creatinine, Ser: 1.41 mg/dL — ABNORMAL HIGH (ref 0.57–1.00)
Globulin, Total: 3.4 g/dL (ref 1.5–4.5)
Glucose: 216 mg/dL — ABNORMAL HIGH (ref 70–99)
Potassium: 4.8 mmol/L (ref 3.5–5.2)
Sodium: 140 mmol/L (ref 134–144)
Total Protein: 7.3 g/dL (ref 6.0–8.5)
eGFR: 47 mL/min/{1.73_m2} — ABNORMAL LOW (ref >59)

## 2021-02-03 LAB — MICROALBUMIN / CREATININE URINE RATIO
Creatinine, Urine: 140.7 mg/dL
Microalb/Creat Ratio: 1520 mg/g creat — ABNORMAL HIGH (ref 0–29)
Microalbumin, Urine: 2139.3 ug/mL

## 2021-02-03 LAB — LIPID PANEL W/O CHOL/HDL RATIO
Cholesterol, Total: 124 mg/dL (ref 100–199)
HDL: 40 mg/dL (ref >39)
LDL Chol Calc (NIH): 53 mg/dL (ref 0–99)
Triglycerides: 186 mg/dL — ABNORMAL HIGH (ref 0–149)
VLDL Cholesterol Cal: 31 mg/dL (ref 5–40)

## 2021-02-03 LAB — CBC WITH DIFFERENTIAL/PLATELET
Basophils Absolute: 0 10*3/uL (ref 0.0–0.2)
Basos: 1 %
EOS (ABSOLUTE): 0.1 10*3/uL (ref 0.0–0.4)
Eos: 1 %
Hematocrit: 37.7 % (ref 34.0–46.6)
Hemoglobin: 12.2 g/dL (ref 11.1–15.9)
Immature Grans (Abs): 0 10*3/uL (ref 0.0–0.1)
Immature Granulocytes: 0 %
Lymphocytes Absolute: 2.1 10*3/uL (ref 0.7–3.1)
Lymphs: 32 %
MCH: 27.5 pg (ref 26.6–33.0)
MCHC: 32.4 g/dL (ref 31.5–35.7)
MCV: 85 fL (ref 79–97)
Monocytes Absolute: 0.6 10*3/uL (ref 0.1–0.9)
Monocytes: 9 %
Neutrophils Absolute: 3.8 10*3/uL (ref 1.4–7.0)
Neutrophils: 57 %
Platelets: 226 10*3/uL (ref 150–450)
RBC: 4.43 x10E6/uL (ref 3.77–5.28)
RDW: 13.8 % (ref 11.7–15.4)
WBC: 6.6 10*3/uL (ref 3.4–10.8)

## 2021-02-03 LAB — HGB A1C W/O EAG: Hgb A1c MFr Bld: 8.4 % — ABNORMAL HIGH (ref 4.8–5.6)

## 2021-02-03 LAB — HEPATITIS C ANTIBODY: Hep C Virus Ab: <0.1 s/co ratio (ref 0.0–0.9)

## 2021-02-03 NOTE — Telephone Encounter (Signed)
Pt seen 02/02/21
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
## 2021-04-14 ENCOUNTER — Other Ambulatory Visit: Payer: Self-pay | Admitting: Internal Medicine

## 2021-04-14 MED ORDER — GLIPIZIDE 5 MG PO TABS: 5.0000 mg | ORAL_TABLET | Freq: Two times a day (BID) | ORAL | 11 refills | Status: DC

## 2021-06-06 NOTE — Progress Notes (Signed)
Return in 2 weeks for repeat bp check when taking meds without missing.

## 2021-07-13 ENCOUNTER — Other Ambulatory Visit: Payer: Self-pay

## 2021-07-26 ENCOUNTER — Other Ambulatory Visit: Payer: Self-pay | Admitting: Internal Medicine

## 2022-05-09 ENCOUNTER — Other Ambulatory Visit: Payer: Self-pay | Admitting: Internal Medicine

## 2022-06-10 LAB — AMB RESULTS CONSOLE CBG: Glucose: 218

## 2022-06-10 LAB — HEMOGLOBIN A1C: Hemoglobin A1C: 8.4

## 2022-06-15 NOTE — Progress Notes (Signed)
Subjective:    Jennifer Dunn - 47 y.o. female MRN [id number]  Date of birth: 1975/08/27  HPI  Jerry Akram is to establish care.  Current issues and/or concerns: - Intermittent nosebleeds x 1 year. Last nosebleed 4 days ago. Reports nosebleeds doesn't last long. Thinks related to dry nose because she does use a space heater when her husband is cold. - Left eye with blurry vision and no additional symptoms.  - States her husband says she snores during sleep. She denies additional symptoms. - Taking Lisinopril and Metoprolol for blood pressure management. She denies red flag symptoms such as but not limited to chest pain, shortness of breath, worst headache of life, nausea/vomiting. She does check her blood pressure outside of office. - Taking Atorvastatin for cholesterol management.  - Taking Metformin for diabetes management. Reports she has not been taking Glipizide due to needing refills.  - Requests referral to Dentistry for routine exam. Also, concern for overbite.    ROS per HPI   Health Maintenance:  Health Maintenance Due  Topic Date Due   OPHTHALMOLOGY EXAM  06/23/2018   COLONOSCOPY (Pts 45-45yr Insurance coverage will need to be confirmed)  Never done   FOOT EXAM  02/25/2020   PAP SMEAR-Modifier  10/24/2021   INFLUENZA VACCINE  11/16/2021   COVID-19 Vaccine (5 - 2023-24 season) 12/17/2021   Diabetic kidney evaluation - eGFR measurement  02/02/2022   Diabetic kidney evaluation - Urine ACR  02/02/2022     Past Medical History: Patient Active Problem List   Diagnosis Date Noted   Microalbuminuria due to type 2 diabetes mellitus ([place] 06/24/2020   Plantar fasciitis, right 02/25/2019   Neck pain, bilateral 02/25/2019   Iron deficiency anemia 02/19/2019   Screening breast examination 10/30/2018   Breast pain, left 10/30/2018   Obesity    Obesity (BMI 30.0-34.9)    Hyperlipidemia 02/21/2017   Stress 02/21/2017   Diabetes mellitus without  complication ([place] [id number]  Hypertension 01/06/2016    Social History   reports that she has never smoked. She has never been exposed to tobacco smoke. She has never used smokeless tobacco. She reports that she does not drink alcohol and does not use drugs.   Family History  family history includes Asthma in her daughter; Cancer in her paternal grandmother; Diabetes in her maternal grandmother; Eczema in her daughter; Edema in her sister; Heart failure in an other family member; Hypertension in her sister.   Medications: reviewed and updated   Objective:   Physical Exam BP 134/85 (BP Location: Left Arm, Patient Position: Sitting, Cuff Size: Large)   Pulse 83   Temp 98.3 F (36.8 C)   Resp 16   Ht 5' 1.42" (1.56 m)   Wt 193 lb (87.5 kg)   SpO2 95%   BMI 35.97 kg/m   Physical Exam HENT:     Head: Normocephalic and atraumatic.     Nose: Nose normal.     Mouth/Throat:     Mouth: Mucous membranes are moist.     Pharynx: Oropharynx is clear.  Eyes:     Extraocular Movements: Extraocular movements intact.     Conjunctiva/sclera: Conjunctivae normal.     Pupils: Pupils are equal, round, and reactive to light.  Cardiovascular:     Rate and Rhythm: Normal rate and regular rhythm.     Pulses: Normal pulses.     Heart sounds: Normal heart sounds.  Pulmonary:     Effort: Pulmonary effort is normal.  Breath sounds: Normal breath sounds.  Musculoskeletal:     Cervical back: Normal range of motion and neck supple.  Neurological:     General: No focal deficit present.     Mental Status: She is alert and oriented to person, place, and time.  Psychiatric:        Mood and Affect: Mood normal.        Behavior: Behavior normal.      Assessment & Plan:  1. Encounter to establish care - Patient presents today to establish care. During the interim follow-up with primary provider as scheduled.  - Return for annual physical examination, labs, and health maintenance. Arrive  fasting meaning having no food for at least 8 hours prior to appointment. You may have only water or black coffee. Please take scheduled medications as normal.  2. Primary hypertension - Continue Lisinopril and Metoprolol as prescribed. No refills needed as of present.  - Counseled on blood pressure goal of less than 130/80, low-sodium, DASH diet, medication compliance, and 150 minutes of moderate intensity exercise per week as tolerated. Counseled on medication adherence and adverse effects. - Follow-up with primary provider in 3 months or sooner if needed.    3. Hyperlipidemia, unspecified hyperlipidemia type - Continue Atorvastatin as prescribed. No refills needed as of present.  - Follow-up with primary provider in 3 months or sooner if needed.   4. Type 2 diabetes mellitus without complication, without long-term current use of insulin (HCC) - Hemoglobin A1c 8.4% on 2/23/23024. - Continue Metformin as prescribed. No refills needed as of present.  - Resume Glipizide as prescribed.  - Discussed the importance of healthy eating habits, low-carbohydrate diet, low-sugar diet, regular aerobic exercise (at least 150 minutes a week as tolerated) and medication compliance to achieve or maintain control of diabetes. - Follow-up with primary provider in 4 weeks or sooner if needed.  - glipiZIDE (GLUCOTROL) 5 MG tablet; Take 1 tablet (5 mg total) by mouth 2 (two) times daily before a meal.  Dispense: 60 tablet; Refill: 2  5. Routine eye exam 6. Blurry vision, left eye - Referral to Ophthalmology for further evaluation/management.  - Ambulatory referral to Ophthalmology  7. Encounter for routine dental examination 8. Overbite - Referral to Dentistry for further evaluation/management. - Ambulatory referral to Dentistry  9. Epistaxis - Referral to ENT for further evaluation/management.  - Ambulatory referral to ENT  10. Snoring - Sleep study for further evaluation.  - [facility] Sleep Study;  Future  11. Language barrier - [place] in-person interpreter, Joaquim Lai.    Patient was given clear instructions to go to Emergency Department or return to medical center if symptoms don't improve, worsen, or new problems develop.The patient verbalized understanding.  I discussed the assessment and treatment plan with the patient. The patient was provided an opportunity to ask questions and all were answered. The patient agreed with the plan and demonstrated an understanding of the instructions.   The patient was advised to call back or seek an in-person evaluation if the symptoms worsen or if the condition fails to improve as anticipated.    Durene Fruits, NP 06/20/2022, 12:38 PM Primary Care at [facility]

## 2022-06-20 ENCOUNTER — Other Ambulatory Visit: Payer: Self-pay

## 2022-06-20 ENCOUNTER — Encounter: Payer: Self-pay | Admitting: Family

## 2022-06-20 ENCOUNTER — Ambulatory Visit (INDEPENDENT_AMBULATORY_CARE_PROVIDER_SITE_OTHER): Payer: Self-pay | Admitting: Family

## 2022-06-20 VITALS — BP 134/85 | HR 83 | Temp 98.3°F | Resp 16 | Ht 61.42 in | Wt 193.0 lb

## 2022-06-20 DIAGNOSIS — R040 Epistaxis: Secondary | ICD-10-CM

## 2022-06-20 DIAGNOSIS — H538 Other visual disturbances: Secondary | ICD-10-CM

## 2022-06-20 DIAGNOSIS — Z0100 Encounter for examination of eyes and vision without abnormal findings: Secondary | ICD-10-CM

## 2022-06-20 DIAGNOSIS — Z7689 Persons encountering health services in other specified circumstances: Secondary | ICD-10-CM

## 2022-06-20 DIAGNOSIS — E785 Hyperlipidemia, unspecified: Secondary | ICD-10-CM

## 2022-06-20 DIAGNOSIS — E119 Type 2 diabetes mellitus without complications: Secondary | ICD-10-CM

## 2022-06-20 DIAGNOSIS — Z0120 Encounter for dental examination and cleaning without abnormal findings: Secondary | ICD-10-CM

## 2022-06-20 DIAGNOSIS — R0683 Snoring: Secondary | ICD-10-CM

## 2022-06-20 DIAGNOSIS — M2629 Other anomalies of dental arch relationship: Secondary | ICD-10-CM

## 2022-06-20 DIAGNOSIS — I10 Essential (primary) hypertension: Secondary | ICD-10-CM

## 2022-06-20 DIAGNOSIS — Z789 Other specified health status: Secondary | ICD-10-CM

## 2022-06-20 MED ORDER — GLIPIZIDE 5 MG PO TABS
5.0000 mg | ORAL_TABLET | Freq: Two times a day (BID) | ORAL | 2 refills | Status: DC
  Filled 2022-06-20: qty 60, 30d supply, fill #0

## 2022-06-20 NOTE — Progress Notes (Signed)
.  Pt presents to establish care,  -experiencing epistaxis for sometime, just experienced one last Thursday -pt states that she bleeds more from left side

## 2022-06-27 ENCOUNTER — Other Ambulatory Visit: Payer: Self-pay

## 2022-06-30 ENCOUNTER — Encounter: Payer: Self-pay | Admitting: *Deleted

## 2022-06-30 NOTE — Progress Notes (Signed)
Pt seen at 06/10/22 screening event where b/p was 162/82 and blood sugar was 218, A1C was 8.4- CCNP set up appt for pt with new PCP to get established and pt was seen on 06/20/22, when her b/p was 134/85 and pt was able to get access to Rx and needed referrals. SDOH identified at event - now pt has PCP to f/u. No additional health equity team support indicated at this time.

## 2022-07-18 ENCOUNTER — Other Ambulatory Visit: Payer: Self-pay | Admitting: Internal Medicine

## 2022-07-18 DIAGNOSIS — E119 Type 2 diabetes mellitus without complications: Secondary | ICD-10-CM

## 2022-07-20 NOTE — Progress Notes (Signed)
Patient ID: Jennifer Dunn, female    DOB: 04-16-1976  MRN: [id number]  CC: Annual Physical Exam   Subjective: Jennifer Dunn is a 47 y.o. female who presents for annual physical exam.   Her concerns today include:  - Doing well on Metformin, no issues/concerns. Reports since last visit she did not begin Glipizide and would like resent to pharmacy. - Needs refills on Lisinopril and Metoprolol. She monitors salt intake. She does not exercise. She does not check her blood pressure outside of office. She denies red flag symptoms such as but not limited to chest pain, shortness of breath, worst headache of life, nausea/vomiting.  - Needs refills on Atorvastatin, no issues/concerns.  - Irregular menses. Reports sometimes she misses several periods at a time. States she feels like she has "womb pain". She denies additional symptoms.   Patient Active Problem List   Diagnosis Date Noted   Microalbuminuria due to type 2 diabetes mellitus 06/24/2020   Plantar fasciitis, right 02/25/2019   Neck pain, bilateral 02/25/2019   Iron deficiency anemia 02/19/2019   Screening breast examination 10/30/2018   Breast pain, left 10/30/2018   Obesity    Obesity (BMI 30.0-34.9)    Hyperlipidemia 02/21/2017   Stress 02/21/2017   Diabetes mellitus without complication 01/06/2016   Hypertension 01/06/2016     Current Outpatient Medications on File Prior to Visit  Medication Sig Dispense Refill   AgaMatrix Ultra-Thin Lancets MISC Check sugars before meals twice daily (Patient taking differently: Check sugars before meals once daily) 60 each 11   Blood Glucose Monitoring Suppl (AGAMATRIX PRESTO PRO METER) DEVI Check blood sugar twice daily before breakfast and dinner 1 Device 0   ferrous gluconate (FERGON) 324 MG tablet 1 tab with half an orange daily.  3   glucose blood (AGAMATRIX PRESTO TEST) test strip Check sugars twice daily before meals (Patient taking differently: Check sugars once  daily before meals) 60 each 12   Multiple Vitamin (MULTIVITAMIN) tablet Take 1 tablet by mouth daily.     No current facility-administered medications on file prior to visit.    No Known Allergies  Social History   Socioeconomic History   Marital status: Significant Other    Spouse name: [name]   Number of children: 4   Years of education: 12   Highest education level: High school graduate  Occupational History   Occupation: Production designer, theatre/television/film  Tobacco Use   Smoking status: Never    Passive exposure: Never   Smokeless tobacco: Never  Vaping Use   Vaping Use: Never used  Substance and Sexual Activity   Alcohol use: Never   Drug use: Never   Sexual activity: Yes    Birth control/protection: Condom  Other Topics Concern   Not on file  Social History Narrative   Has been with [name], a patient here, since 2003   They have one child together.   Lives at home with [name] and her 3 youngest children   Her oldest child is in [place].     Patient is already a grandmother.   Social Determinants of Health   Financial Resource Strain: Low Risk  (06/22/2017)   Overall Financial Resource Strain (CARDIA)    Difficulty of Paying Living Expenses: Not very hard  Food Insecurity: Food Insecurity Present (06/10/2022)   Hunger Vital Sign    Worried About Running Out of Food in the Last Year: Often true    Ran Out of Food in the Last Year: Often true  Transportation  Needs: No Transportation Needs (06/10/2022)   PRAPARE - Administrator, Civil Service (Medical): No    Lack of Transportation (Non-Medical): No  Physical Activity: Inactive (03/30/2017)   Exercise Vital Sign    Days of Exercise per Week: 0 days    Minutes of Exercise per Session: 0 min  Stress: No Stress Concern Present (03/30/2017)   [employer] of Occupational Health - Occupational Stress Questionnaire    Feeling of Stress : Only a little  Social Connections: Moderately Integrated (03/30/2017)    Social Connection and Isolation Panel [NHANES]    Frequency of Communication with Friends and Family: More than three times a week    Frequency of Social Gatherings with Friends and Family: Once a week    Attends Religious Services: 1 to 4 times per year    Active Member of [employer] or Organizations: No    Attends Banker Meetings: Never    Marital Status: Living with partner  Intimate Partner Violence: Not At Risk (06/10/2022)   Humiliation, Afraid, Rape, and Kick questionnaire    Fear of Current or Ex-Partner: No    Emotionally Abused: No    Physically Abused: No    Sexually Abused: No    Family History  Problem Relation Age of Onset   Hypertension Sister    Edema Sister    Asthma Daughter    Eczema Daughter    Diabetes Maternal Grandmother    Cancer Paternal Grandmother        cervical   Heart failure Other    Breast cancer Neg Hx     No past surgical history on file.  ROS: Review of Systems Negative except as stated above  PHYSICAL EXAM: BP 137/89 (BP Location: Right Arm, Patient Position: Sitting, Cuff Size: Normal)   Pulse 72   Temp 98.6 F (37 C)   Resp 14   Ht 5\' 1"  (1.549 m)   Wt 200 lb 9.6 oz (91 kg)   LMP 01/10/2022   SpO2 98%   BMI 37.90 kg/m   Physical Exam HENT:     Head: Normocephalic and atraumatic.     Right Ear: Tympanic membrane, ear canal and external ear normal.     Left Ear: Tympanic membrane, ear canal and external ear normal.     Nose: Nose normal.     Mouth/Throat:     Mouth: Mucous membranes are moist.     Pharynx: Oropharynx is clear.  Eyes:     Extraocular Movements: Extraocular movements intact.     Conjunctiva/sclera: Conjunctivae normal.     Pupils: Pupils are equal, round, and reactive to light.  Cardiovascular:     Rate and Rhythm: Normal rate and regular rhythm.     Pulses: Normal pulses.     Heart sounds: Normal heart sounds.  Pulmonary:     Effort: Pulmonary effort is normal.     Breath sounds: Normal  breath sounds.  Chest:     Comments: Patient declined.  Abdominal:     General: Bowel sounds are normal.     Palpations: Abdomen is soft.  Genitourinary:    General: Normal vulva.     Vagina: Normal.     Cervix: Normal.     Uterus: Normal.      Adnexa: Right adnexa normal and left adnexa normal.     Comments: Sabino Snipes, CMA present.  Musculoskeletal:        General: Normal range of motion.     Right shoulder:  Normal.     Left shoulder: Normal.     Right upper arm: Normal.     Left upper arm: Normal.     Right elbow: Normal.     Left elbow: Normal.     Right forearm: Normal.     Left forearm: Normal.     Right wrist: Normal.     Left wrist: Normal.     Right hand: Normal.     Left hand: Normal.     Cervical back: Normal, normal range of motion and neck supple.     Thoracic back: Normal.     Lumbar back: Normal.     Right hip: Normal.     Left hip: Normal.     Right upper leg: Normal.     Left upper leg: Normal.     Right knee: Normal.     Left knee: Normal.     Right lower leg: Normal.     Left lower leg: Normal.     Right ankle: Normal.     Left ankle: Normal.     Right foot: Normal.     Left foot: Normal.  Skin:    General: Skin is warm and dry.     Capillary Refill: Capillary refill takes less than 2 seconds.  Neurological:     General: No focal deficit present.     Mental Status: She is alert and oriented to person, place, and time.  Psychiatric:        Mood and Affect: Mood normal.        Behavior: Behavior normal.      Diabetic Foot Exam - Simple   Simple Foot Form Visual Inspection No deformities, no ulcerations, no other skin breakdown bilaterally: Yes Sensation Testing Intact to touch and monofilament testing bilaterally: Yes Pulse Check Posterior Tibialis and Dorsalis pulse intact bilaterally: Yes Comments     ASSESSMENT AND PLAN: 1. Annual physical exam - Counseled on 150 minutes of exercise per week as tolerated, healthy eating  (including decreased daily intake of saturated fats, cholesterol, added sugars, sodium), STI prevention, and routine healthcare maintenance.  2. Screening for metabolic disorder - Routine screening.  - CMP14+EGFR  3. Screening for deficiency anemia - Routine screening.  - CBC  4. Thyroid disorder screen - Routine screening.  - TSH  5. Primary hypertension - Continue Lisinopril and Metoprolol Tartrate as prescribed.  - Counseled on blood pressure goal of less than 130/80, low-sodium, DASH diet, medication compliance, and 150 minutes of moderate intensity exercise per week as tolerated. Counseled on medication adherence and adverse effects. - Follow-up with primary provider in 3 months or sooner if needed.  - lisinopril (ZESTRIL) 30 MG tablet; TAKE 1 TABLET BY MOUTH ONCE DAILY.  Dispense: 90 tablet; Refill: 0 - metoprolol tartrate (LOPRESSOR) 25 MG tablet; Take 1/2 (one-half) tablet by mouth twice daily  Dispense: 90 tablet; Refill: 2  6. Type 2 diabetes mellitus without complication, without long-term current use of insulin - Metformin and Glipizide as prescribed.  - Routine screening.  - Discussed the importance of healthy eating habits, low-carbohydrate diet, low-sugar diet, regular aerobic exercise (at least 150 minutes a week as tolerated) and medication compliance to achieve or maintain control of diabetes. - Follow-up with primary provider as scheduled.  - Microalbumin / creatinine urine ratio - Hemoglobin A1c - glipiZIDE (GLUCOTROL) 5 MG tablet; Take 1 tablet (5 mg total) by mouth 2 (two) times daily before a meal.  Dispense: 60 tablet; Refill: 2 - metFORMIN (GLUCOPHAGE-XR)  500 MG 24 hr tablet; Take 1 tablet (500 mg total) by mouth 2 (two) times daily.  Dispense: 180 tablet; Refill: 0  7. Encounter for diabetic foot exam - Completed today in office.   8. Hyperlipidemia, unspecified hyperlipidemia type - Continue Atorvastatin as prescribed.  - Routine screening.  - Follow-up  with primary provider as scheduled. - Lipid panel - atorvastatin (LIPITOR) 20 MG tablet; Take 1 tablet (20 mg total) by mouth daily.  Dispense: 90 tablet; Refill: 0  9. Encounter for screening mammogram for malignant neoplasm of breast - Routine screening.  - MM Digital Screening; Future  10. Pap smear for cervical cancer screening - Routine screening.  - Cytology - PAP([facility])  11. Routine screening for STI (sexually transmitted infection) - Routine screening.  - Cervicovaginal ancillary only  12. Irregular menses - Routine screening.  - Referral to Gynecology for further evaluation/management.  - hCG, serum, qualitative - Ambulatory referral to Gynecology  13. Colon cancer screening - Referral to Gastroenterology for further evaluation/management.  - Ambulatory referral to Gastroenterology  14. Language barrier - Photographer. Name: Derek Mound  ID#: [id number]   Patient was given the opportunity to ask questions.  Patient verbalized understanding of the plan and was able to repeat key elements of the plan. Patient was given clear instructions to go to Emergency Department or return to medical center if symptoms don't improve, worsen, or new problems develop.The patient verbalized understanding.   Orders Placed This Encounter  Procedures   MM Digital Screening   Microalbumin / creatinine urine ratio   CBC   Lipid panel   TSH   CMP14+EGFR   Hemoglobin A1c   hCG, serum, qualitative   Ambulatory referral to Gastroenterology   Ambulatory referral to Gynecology     Requested Prescriptions   Signed Prescriptions Disp Refills   glipiZIDE (GLUCOTROL) 5 MG tablet 60 tablet 2    Sig: Take 1 tablet (5 mg total) by mouth 2 (two) times daily before a meal.   lisinopril (ZESTRIL) 30 MG tablet 90 tablet 0    Sig: TAKE 1 TABLET BY MOUTH ONCE DAILY.   metoprolol tartrate (LOPRESSOR) 25 MG tablet 90 tablet 2    Sig: Take 1/2 (one-half) tablet by mouth twice daily    metFORMIN (GLUCOPHAGE-XR) 500 MG 24 hr tablet 180 tablet 0    Sig: Take 1 tablet (500 mg total) by mouth 2 (two) times daily.   atorvastatin (LIPITOR) 20 MG tablet 90 tablet 0    Sig: Take 1 tablet (20 mg total) by mouth daily.    Return in about 1 year (around 07/27/2023) for Physical per patient preference.  Rema Fendt, NP

## 2022-07-26 ENCOUNTER — Encounter: Payer: Self-pay | Admitting: Family

## 2022-07-27 ENCOUNTER — Encounter: Payer: Self-pay | Admitting: Family

## 2022-07-27 ENCOUNTER — Other Ambulatory Visit (HOSPITAL_COMMUNITY)
Admission: RE | Admit: 2022-07-27 | Discharge: 2022-07-27 | Disposition: A | Discharge status: Home / Self Care | Payer: Self-pay | Source: Ambulatory Visit | Attending: Family | Admitting: Family

## 2022-07-27 ENCOUNTER — Ambulatory Visit (INDEPENDENT_AMBULATORY_CARE_PROVIDER_SITE_OTHER): Payer: Self-pay | Admitting: Family

## 2022-07-27 VITALS — BP 137/89 | HR 72 | Temp 98.6°F | Resp 14 | Ht 61.0 in | Wt 200.6 lb

## 2022-07-27 DIAGNOSIS — Z0000 Encounter for general adult medical examination without abnormal findings: Secondary | ICD-10-CM

## 2022-07-27 DIAGNOSIS — N926 Irregular menstruation, unspecified: Secondary | ICD-10-CM

## 2022-07-27 DIAGNOSIS — Z124 Encounter for screening for malignant neoplasm of cervix: Secondary | ICD-10-CM | POA: Insufficient documentation

## 2022-07-27 DIAGNOSIS — E785 Hyperlipidemia, unspecified: Secondary | ICD-10-CM

## 2022-07-27 DIAGNOSIS — Z113 Encounter for screening for infections with a predominantly sexual mode of transmission: Secondary | ICD-10-CM | POA: Insufficient documentation

## 2022-07-27 DIAGNOSIS — Z1329 Encounter for screening for other suspected endocrine disorder: Secondary | ICD-10-CM

## 2022-07-27 DIAGNOSIS — E119 Type 2 diabetes mellitus without complications: Secondary | ICD-10-CM

## 2022-07-27 DIAGNOSIS — Z13228 Encounter for screening for other metabolic disorders: Secondary | ICD-10-CM

## 2022-07-27 DIAGNOSIS — Z1231 Encounter for screening mammogram for malignant neoplasm of breast: Secondary | ICD-10-CM

## 2022-07-27 DIAGNOSIS — Z603 Acculturation difficulty: Secondary | ICD-10-CM

## 2022-07-27 DIAGNOSIS — I10 Essential (primary) hypertension: Secondary | ICD-10-CM

## 2022-07-27 DIAGNOSIS — Z130 Encounter for screening for diseases of the blood and blood-forming organs and certain disorders involving the immune mechanism: Secondary | ICD-10-CM

## 2022-07-27 DIAGNOSIS — Z1211 Encounter for screening for malignant neoplasm of colon: Secondary | ICD-10-CM

## 2022-07-27 DIAGNOSIS — Z758 Other problems related to medical facilities and other health care: Secondary | ICD-10-CM

## 2022-07-27 MED ORDER — METFORMIN HCL ER 500 MG PO TB24: 500.0000 mg | ORAL_TABLET | Freq: Two times a day (BID) | ORAL | 0 refills | Status: DC

## 2022-07-27 MED ORDER — GLIPIZIDE 5 MG PO TABS: 5.0000 mg | ORAL_TABLET | Freq: Two times a day (BID) | ORAL | 2 refills | Status: DC

## 2022-07-27 MED ORDER — LISINOPRIL 30 MG PO TABS: ORAL_TABLET | ORAL | 0 refills | Status: DC

## 2022-07-27 MED ORDER — METOPROLOL TARTRATE 25 MG PO TABS: ORAL_TABLET | ORAL | 2 refills | Status: AC

## 2022-07-27 MED ORDER — ATORVASTATIN CALCIUM 20 MG PO TABS: 20.0000 mg | ORAL_TABLET | Freq: Every day | ORAL | 0 refills | Status: DC

## 2022-07-27 NOTE — Progress Notes (Signed)
Pt is here for annual physical   Complaining of left lower quadrant abdominal pain on and off for X3 months   Irregular menstrual cycles, skip several months at a time  States she has been out of her BP medication for X1 week

## 2022-07-27 NOTE — Patient Instructions (Signed)
Preventive Care 40-48 Years Old, Female Preventive care refers to lifestyle choices and visits with your health care provider that can promote health and wellness. Preventive care visits are also called wellness exams. What can I expect for my preventive care visit? Counseling Your health care provider may ask you questions about your: Medical history, including: Past medical problems. Family medical history. Pregnancy history. Current health, including: Menstrual cycle. Method of birth control. Emotional well-being. Home life and relationship well-being. Sexual activity and sexual health. Lifestyle, including: Alcohol, nicotine or tobacco, and drug use. Access to firearms. Diet, exercise, and sleep habits. Work and work environment. Sunscreen use. Safety issues such as seatbelt and bike helmet use. Physical exam Your health care provider will check your: Height and weight. These may be used to calculate your BMI (body mass index). BMI is a measurement that tells if you are at a healthy weight. Waist circumference. This measures the distance around your waistline. This measurement also tells if you are at a healthy weight and may help predict your risk of certain diseases, such as type 2 diabetes and high blood pressure. Heart rate and blood pressure. Body temperature. Skin for abnormal spots. What immunizations do I need?  Vaccines are usually given at various ages, according to a schedule. Your health care provider will recommend vaccines for you based on your age, medical history, and lifestyle or other factors, such as travel or where you work. What tests do I need? Screening Your health care provider may recommend screening tests for certain conditions. This may include: Lipid and cholesterol levels. Diabetes screening. This is done by checking your blood sugar (glucose) after you have not eaten for a while (fasting). Pelvic exam and Pap test. Hepatitis B test. Hepatitis C  test. HIV (human immunodeficiency virus) test. STI (sexually transmitted infection) testing, if you are at risk. Lung cancer screening. Colorectal cancer screening. Mammogram. Talk with your health care provider about when you should start having regular mammograms. This may depend on whether you have a family history of breast cancer. BRCA-related cancer screening. This may be done if you have a family history of breast, ovarian, tubal, or peritoneal cancers. Bone density scan. This is done to screen for osteoporosis. Talk with your health care provider about your test results, treatment options, and if necessary, the need for more tests. Follow these instructions at home: Eating and drinking  Eat a diet that includes fresh fruits and vegetables, whole grains, lean protein, and low-fat dairy products. Take vitamin and mineral supplements as recommended by your health care provider. Do not drink alcohol if: Your health care provider tells you not to drink. You are pregnant, may be pregnant, or are planning to become pregnant. If you drink alcohol: Limit how much you have to 0-1 drink a day. Know how much alcohol is in your drink. In the [place] one drink equals one 12 oz bottle of beer (355 mL), one 5 oz glass of wine (148 mL), or one 1 oz glass of hard liquor (44 mL). Lifestyle Brush your teeth every morning and night with fluoride toothpaste. Floss one time each day. Exercise for at least 30 minutes 5 or more days each week. Do not use any products that contain nicotine or tobacco. These products include cigarettes, chewing tobacco, and vaping devices, such as e-cigarettes. If you need help quitting, ask your health care provider. Do not use drugs. If you are sexually active, practice safe sex. Use a condom or other form of protection to   prevent STIs. If you do not wish to become pregnant, use a form of birth control. If you plan to become pregnant, see your health care provider for a  prepregnancy visit. Take aspirin only as told by your health care provider. Make sure that you understand how much to take and what form to take. Work with your health care provider to find out whether it is safe and beneficial for you to take aspirin daily. Find healthy ways to manage stress, such as: Meditation, yoga, or listening to music. Journaling. Talking to a trusted person. Spending time with friends and family. Minimize exposure to UV radiation to reduce your risk of skin cancer. Safety Always wear your seat belt while driving or riding in a vehicle. Do not drive: If you have been drinking alcohol. Do not ride with someone who has been drinking. When you are tired or distracted. While texting. If you have been using any mind-altering substances or drugs. Wear a helmet and other protective equipment during sports activities. If you have firearms in your house, make sure you follow all gun safety procedures. Seek help if you have been physically or sexually abused. What's next? Visit your health care provider once a year for an annual wellness visit. Ask your health care provider how often you should have your eyes and teeth checked. Stay up to date on all vaccines. This information is not intended to replace advice given to you by your health care provider. Make sure you discuss any questions you have with your health care provider. Document Revised: 09/30/2020 Document Reviewed: 09/30/2020 Elsevier Patient Education  2023 Elsevier Inc.  

## 2022-07-28 LAB — MICROALBUMIN / CREATININE URINE RATIO
Creatinine, Urine: 62.9 mg/dL
Microalb/Creat Ratio: 1333 mg/g creat — ABNORMAL HIGH (ref 0–29)
Microalbumin, Urine: 838.7 ug/mL

## 2022-07-28 LAB — CMP14+EGFR
ALT: 26 IU/L (ref 0–32)
AST: 21 IU/L (ref 0–40)
Albumin/Globulin Ratio: 1.1 — ABNORMAL LOW (ref 1.2–2.2)
Albumin: 3.7 g/dL — ABNORMAL LOW (ref 3.9–4.9)
Alkaline Phosphatase: 152 IU/L — ABNORMAL HIGH (ref 44–121)
BUN/Creatinine Ratio: 15 (ref 9–23)
BUN: 23 mg/dL (ref 6–24)
Bilirubin Total: <0.2 mg/dL (ref 0.0–1.2)
CO2: 22 mmol/L (ref 20–29)
Calcium: 9.1 mg/dL (ref 8.7–10.2)
Chloride: 100 mmol/L (ref 96–106)
Creatinine, Ser: 1.53 mg/dL — ABNORMAL HIGH (ref 0.57–1.00)
Globulin, Total: 3.3 g/dL (ref 1.5–4.5)
Glucose: 296 mg/dL — ABNORMAL HIGH (ref 70–99)
Potassium: 4.4 mmol/L (ref 3.5–5.2)
Sodium: 137 mmol/L (ref 134–144)
Total Protein: 7 g/dL (ref 6.0–8.5)
eGFR: 42 mL/min/{1.73_m2} — ABNORMAL LOW (ref >59)

## 2022-07-28 LAB — CBC
Hematocrit: 28.8 % — ABNORMAL LOW (ref 34.0–46.6)
Hemoglobin: 8.2 g/dL — ABNORMAL LOW (ref 11.1–15.9)
MCH: 20.7 pg — ABNORMAL LOW (ref 26.6–33.0)
MCHC: 28.5 g/dL — ABNORMAL LOW (ref 31.5–35.7)
MCV: 73 fL — ABNORMAL LOW (ref 79–97)
Platelets: 235 10*3/uL (ref 150–450)
RBC: 3.96 x10E6/uL (ref 3.77–5.28)
RDW: 16.4 % — ABNORMAL HIGH (ref 11.7–15.4)
WBC: 7.4 10*3/uL (ref 3.4–10.8)

## 2022-07-28 LAB — HEMOGLOBIN A1C
Est. average glucose Bld gHb Est-mCnc: 240 mg/dL
Hgb A1c MFr Bld: 10 % — ABNORMAL HIGH (ref 4.8–5.6)

## 2022-07-28 LAB — LIPID PANEL
Chol/HDL Ratio: 2.9 ratio (ref 0.0–4.4)
Cholesterol, Total: 125 mg/dL (ref 100–199)
HDL: 43 mg/dL (ref >39)
LDL Chol Calc (NIH): 52 mg/dL (ref 0–99)
Triglycerides: 184 mg/dL — ABNORMAL HIGH (ref 0–149)
VLDL Cholesterol Cal: 30 mg/dL (ref 5–40)

## 2022-07-28 LAB — CERVICOVAGINAL ANCILLARY ONLY
Bacterial Vaginitis (gardnerella): NEGATIVE
Candida Glabrata: NEGATIVE
Candida Vaginitis: NEGATIVE
Chlamydia: NEGATIVE
Comment: NEGATIVE
Comment: NEGATIVE
Comment: NEGATIVE
Comment: NEGATIVE
Comment: NEGATIVE
Comment: NORMAL
Neisseria Gonorrhea: NEGATIVE
Trichomonas: NEGATIVE

## 2022-07-28 LAB — TSH: TSH: 2.84 u[IU]/mL (ref 0.450–4.500)

## 2022-07-28 LAB — HCG, SERUM, QUALITATIVE: hCG,Beta Subunit,Qual,Serum: NEGATIVE m[IU]/mL (ref <6)

## 2022-07-29 ENCOUNTER — Other Ambulatory Visit: Payer: Self-pay | Admitting: Family

## 2022-07-29 DIAGNOSIS — N1832 Chronic kidney disease, stage 3b: Secondary | ICD-10-CM

## 2022-07-29 DIAGNOSIS — D649 Anemia, unspecified: Secondary | ICD-10-CM

## 2022-07-29 MED ORDER — IRON (FERROUS SULFATE) 325 (65 FE) MG PO TABS
325.0000 mg | ORAL_TABLET | Freq: Every day | ORAL | 1 refills | Status: AC
Start: 2022-07-29 — End: ?

## 2022-08-01 LAB — CYTOLOGY - PAP
Comment: NEGATIVE
Diagnosis: NEGATIVE
High risk HPV: NEGATIVE

## 2022-08-17 ENCOUNTER — Encounter: Payer: Self-pay | Admitting: Gastroenterology

## 2022-08-17 NOTE — Progress Notes (Signed)
Erroneous encounter-disregard

## 2022-08-22 ENCOUNTER — Encounter: Payer: Self-pay | Admitting: Family

## 2022-08-22 DIAGNOSIS — E119 Type 2 diabetes mellitus without complications: Secondary | ICD-10-CM

## 2022-08-22 DIAGNOSIS — Z603 Acculturation difficulty: Secondary | ICD-10-CM

## 2022-08-22 DIAGNOSIS — D649 Anemia, unspecified: Secondary | ICD-10-CM

## 2022-09-01 ENCOUNTER — Ambulatory Visit (AMBULATORY_SURGERY_CENTER): Payer: Self-pay

## 2022-09-01 VITALS — Ht 61.0 in | Wt 193.0 lb

## 2022-09-01 DIAGNOSIS — Z1211 Encounter for screening for malignant neoplasm of colon: Secondary | ICD-10-CM

## 2022-09-01 MED ORDER — NA SULFATE-K SULFATE-MG SULF 17.5-3.13-1.6 GM/177ML PO SOLN: 1.0000 | Freq: Once | ORAL | 0 refills | Status: AC

## 2022-09-01 NOTE — Progress Notes (Signed)
[facility] Pre-Visit Note  Pre visit Date 09/01/22     Jennifer Dunn    [id number]    03-Mar-1976  Primary Care Physician:Stephens, Salomon Fick, NP  Referring Physician: Rema Fendt, NP [address] [place],  [place] [address]  Pre visit In Person Direct  Last Colonoscopy:N/A  Last office visit:N/A  Procedure: Colonoscopy Date/Time of procedure: 06/11/22 Procedure MD: Tomasa Rand Procedure Diagnosis Code: screening  Blood thinners: No   Bowel Prep: suprep  Constipation: No   Insurance:  Pharmacy: [facility]  Patient instructed to use Singlecare.com or GoodRx for a price reduction on prep  There were no vitals filed for this visit.  There is no height or weight on file to calculate BMI.  Height: Weight:     No egg or soy allergy known to patient No issues known to patient with past sedation with any surgeries or procedures No history of difficulty with intubation No FH of Malignant Hyperthermia No weight loss medications or diet pills Patient is not on home 02 No history of A fib or A flutter No upcoming cardiac appt. pending  ECHO/ EF: no AICD/Pacemaker: no  Any cardiac testing pending: no  Allergies as of 09/01/2022   (No Known Allergies)     MEDICATIONs Instructed to HOLD:Iron Metformin Glipizide   Outpatient Encounter Medications as of 09/01/2022  Medication Sig   AgaMatrix Ultra-Thin Lancets MISC Check sugars before meals twice daily (Patient taking differently: Check sugars before meals once daily)   atorvastatin (LIPITOR) 20 MG tablet Take 1 tablet (20 mg total) by mouth daily.   Blood Glucose Monitoring Suppl (AGAMATRIX PRESTO PRO METER) DEVI Check blood sugar twice daily before breakfast and dinner   glipiZIDE (GLUCOTROL) 5 MG tablet Take 1 tablet (5 mg total) by mouth 2 (two) times daily before a meal.   glucose blood (AGAMATRIX PRESTO TEST) test strip Check sugars twice daily before meals (Patient taking  differently: Check sugars once daily before meals)   Iron, Ferrous Sulfate, 325 (65 Fe) MG TABS Take 325 mg by mouth daily.   lisinopril (ZESTRIL) 30 MG tablet TAKE 1 TABLET BY MOUTH ONCE DAILY.   metFORMIN (GLUCOPHAGE-XR) 500 MG 24 hr tablet Take 1 tablet (500 mg total) by mouth 2 (two) times daily.   metoprolol tartrate (LOPRESSOR) 25 MG tablet Take 1/2 (one-half) tablet by mouth twice daily   Multiple Vitamin (MULTIVITAMIN) tablet Take 1 tablet by mouth daily.   No facility-administered encounter medications on file as of 09/01/2022.     Past Medical History:  Diagnosis Date   Diabetes mellitus without complication (HCC)    Hyperlipidemia    Hypertension    Iron deficiency anemia 02/19/2019   Obesity (BMI 30.0-34.9)    Stress 02/21/2017    No past surgical history on file.  Family History  Problem Relation Age of Onset   Hypertension Sister    Edema Sister    Asthma Daughter    Eczema Daughter    Diabetes Maternal Grandmother    Cancer Paternal Grandmother        cervical   Heart failure Other    Breast cancer Neg Hx      Fall Risk:     03/05/2018    2:00 AM 06/20/2022   10:49 AM 07/27/2022    9:24 AM  Fall Risk  Falls in the past year?  0 0  Was there an injury with Fall?  0 0  Fall Risk Category Calculator  0 0  (RETIRED) Patient  Fall Risk Level Low fall risk    Patient at Risk for Falls Due to  No Fall Risks   Fall risk Follow up  Falls evaluation completed      Patient's chart reviewed by Cathlyn Parsons CNRA prior to previsit and patient is appropriate to undergo procedure in [facility].  Previsit completed and red dot placed by patient's name on their procedure day (on provider's schedule).      To be filled in admitting on day of procedure:  BP:               HR:                O2 Sat:               Temp:                       Blood glucose:  LMP:  Post menopause/IUD no, uses condom  Pregnancy Test:  Blood Thinners: no  Last Dose:na

## 2022-09-01 NOTE — Progress Notes (Signed)
[facility] Pre-Visit Note  Pre visit Date 09/01/22     Jaeana Perkes    [id number]    [birth year]  Primary Care Physician:Stephens, Salomon Fick, NP  Referring Physician: Rema Fendt, NP [address] [place],  [place] [address]  Pre visit In Person Direct  Last Colonoscopy:N/A  Last office visit:N/A  Procedure: Colonoscopy Date/Time of procedure: 06/11/22 Procedure MD: Tomasa Rand Procedure Diagnosis Code: screening  Blood thinners: No   Bowel Prep: suprep  Constipation: No   Insurance: [facility]  Pharmacy: [facility]  Patient instructed to use Singlecare.com or GoodRx for a price reduction on prep  There were no vitals filed for this visit.  Body mass index is 36.47 kg/m.  Height: 5'1' Weight: 193lbs  Last Weight  Most recent update: 09/01/2022  3:47 PM    Weight  87.5 kg (193 lb)              No egg or soy allergy known to patient No issues known to patient with past sedation with any surgeries or procedures No history of difficulty with intubation No FH of Malignant Hyperthermia No weight loss medications or diet pills Patient is not on home 02 No history of A fib or A flutter No upcoming cardiac appt. pending  ECHO/ EF: no AICD/Pacemaker: no  Any cardiac testing pending: no  Allergies as of 09/01/2022   (No Known Allergies)     MEDICATIONs Instructed to HOLD:Iron Metformin Glipizide   Outpatient Encounter Medications as of 09/01/2022  Medication Sig   AgaMatrix Ultra-Thin Lancets MISC Check sugars before meals twice daily (Patient taking differently: Check sugars before meals once daily)   atorvastatin (LIPITOR) 20 MG tablet Take 1 tablet (20 mg total) by mouth daily.   Blood Glucose Monitoring Suppl (AGAMATRIX PRESTO PRO METER) DEVI Check blood sugar twice daily before breakfast and dinner   glipiZIDE (GLUCOTROL) 5 MG tablet Take 1 tablet (5 mg total) by mouth 2 (two) times  daily before a meal.   glucose blood (AGAMATRIX PRESTO TEST) test strip Check sugars twice daily before meals (Patient taking differently: Check sugars once daily before meals)   lisinopril (ZESTRIL) 30 MG tablet TAKE 1 TABLET BY MOUTH ONCE DAILY.   metFORMIN (GLUCOPHAGE-XR) 500 MG 24 hr tablet Take 1 tablet (500 mg total) by mouth 2 (two) times daily.   metoprolol tartrate (LOPRESSOR) 25 MG tablet Take 1/2 (one-half) tablet by mouth twice daily   Multiple Vitamin (MULTIVITAMIN) tablet Take 1 tablet by mouth daily.   Iron, Ferrous Sulfate, 325 (65 Fe) MG TABS Take 325 mg by mouth daily. (Patient not taking: Reported on 09/01/2022)   No facility-administered encounter medications on file as of 09/01/2022.     Past Medical History:  Diagnosis Date   Allergy    Anxiety    Asthma    Chronic kidney disease    Diabetes mellitus without complication (HCC)    GERD (gastroesophageal reflux disease)    Hyperlipidemia    Hypertension    Iron deficiency anemia 02/19/2019   Obesity (BMI 30.0-34.9)    Stress 02/21/2017    History reviewed. No pertinent surgical history.  Family History  Problem Relation Age of Onset   Hypertension Sister    Edema Sister    Diabetes Maternal Grandmother    Cancer Paternal Grandmother        cervical   Asthma Daughter    Eczema Daughter    Heart failure Other    Breast cancer  Neg Hx    Colon cancer Neg Hx    Colon polyps Neg Hx    Esophageal cancer Neg Hx    Rectal cancer Neg Hx    Stomach cancer Neg Hx      Fall Risk:     03/05/2018    2:00 AM 06/20/2022   10:49 AM 07/27/2022    9:24 AM  Fall Risk  Falls in the past year?  0 0  Was there an injury with Fall?  0 0  Fall Risk Category Calculator  0 0  (RETIRED) Patient Fall Risk Level Low fall risk    Patient at Risk for Falls Due to  No Fall Risks   Fall risk Follow up  Falls evaluation completed      Patient's chart reviewed by Cathlyn Parsons CNRA prior to previsit and patient is appropriate  to undergo procedure in [facility].  Previsit completed and red dot placed by patient's name on their procedure day (on provider's schedule).      To be filled in admitting on day of procedure:  BP:               HR:                O2 Sat:               Temp:                       Blood glucose:  LMP:  Post menopause/IUD no, uses condom  Pregnancy Test:  Blood Thinners: no  Last Dose: n/a

## 2022-09-07 ENCOUNTER — Telehealth: Payer: Self-pay | Admitting: Gastroenterology

## 2022-09-07 DIAGNOSIS — Z1211 Encounter for screening for malignant neoplasm of colon: Secondary | ICD-10-CM

## 2022-09-07 MED ORDER — NA SULFATE-K SULFATE-MG SULF 17.5-3.13-1.6 GM/177ML PO SOLN: 1.0000 | Freq: Once | ORAL | 0 refills | Status: AC

## 2022-09-07 NOTE — Telephone Encounter (Signed)
Spoke with son and resent prescription to pharmacy

## 2022-09-07 NOTE — Telephone Encounter (Signed)
Inbound call from son, states [facility] stated they did not have prep medication at pharmacy, and would like it resent.

## 2022-09-09 ENCOUNTER — Encounter: Payer: Self-pay | Admitting: Gastroenterology

## 2022-09-09 ENCOUNTER — Ambulatory Visit (AMBULATORY_SURGERY_CENTER): Payer: Self-pay | Admitting: Gastroenterology

## 2022-09-09 VITALS — BP 107/62 | HR 74 | Temp 98.9°F | Resp 14 | Ht 61.0 in | Wt 193.0 lb

## 2022-09-09 DIAGNOSIS — Z1211 Encounter for screening for malignant neoplasm of colon: Secondary | ICD-10-CM

## 2022-09-09 HISTORY — PX: COLONOSCOPY: SHX174

## 2022-09-09 MED ORDER — SODIUM CHLORIDE 0.9 % IV SOLN
500.0000 mL | Freq: Once | INTRAVENOUS | Status: DC
Start: 2022-09-09 — End: 2022-09-09

## 2022-09-09 NOTE — Progress Notes (Signed)
[facility] History and Physical   Primary Care Physician:  Rema Fendt, NP   Reason for Procedure:   Colon cancer screening  Plan:    Screening colonoscopy     HPI: Jennifer Dunn is a 47 y.o. female undergoing initial average risk screening colonoscopy.  She has no family history of colon cancer and no chronic GI symptoms.    Past Medical History:  Diagnosis Date   Allergy    Anxiety    Asthma    Chronic kidney disease    Diabetes mellitus without complication (HCC)    GERD (gastroesophageal reflux disease)    Hyperlipidemia    Hypertension    Iron deficiency anemia 02/19/2019   Obesity (BMI 30.0-34.9)    Stress 02/21/2017    Past Surgical History:  Procedure Laterality Date   COLONOSCOPY  09/09/2022    Prior to Admission medications   Medication Sig Start Date End Date Taking? Authorizing Provider  AgaMatrix Ultra-Thin Lancets MISC Check sugars before meals twice daily Patient taking differently: Check sugars before meals once daily 10/25/18   Julieanne Manson, MD  atorvastatin (LIPITOR) 20 MG tablet Take 1 tablet (20 mg total) by mouth daily. 07/27/22   Rema Fendt, NP  Blood Glucose Monitoring Suppl (AGAMATRIX PRESTO PRO METER) DEVI Check blood sugar twice daily before breakfast and dinner 03/31/17   Julieanne Manson, MD  glipiZIDE (GLUCOTROL) 5 MG tablet Take 1 tablet (5 mg total) by mouth 2 (two) times daily before a meal. 07/27/22 10/25/22  Rema Fendt, NP  glucose blood (AGAMATRIX PRESTO TEST) test strip Check sugars twice daily before meals Patient taking differently: Check sugars once daily before meals 10/25/18   Julieanne Manson, MD  Iron, Ferrous Sulfate, 325 (65 Fe) MG TABS Take 325 mg by mouth daily. Patient not taking: Reported on 09/01/2022 07/29/22   Rema Fendt, NP  lisinopril (ZESTRIL) 30 MG tablet TAKE 1 TABLET BY MOUTH ONCE DAILY. 07/27/22   Rema Fendt, NP  metFORMIN (GLUCOPHAGE-XR) 500 MG 24 hr tablet Take  1 tablet (500 mg total) by mouth 2 (two) times daily. 07/27/22   Rema Fendt, NP  metoprolol tartrate (LOPRESSOR) 25 MG tablet Take 1/2 (one-half) tablet by mouth twice daily 07/27/22   Rema Fendt, NP  Multiple Vitamin (MULTIVITAMIN) tablet Take 1 tablet by mouth daily.    [provider]    Current Outpatient Medications  Medication Sig Dispense Refill   AgaMatrix Ultra-Thin Lancets MISC Check sugars before meals twice daily (Patient taking differently: Check sugars before meals once daily) 60 each 11   atorvastatin (LIPITOR) 20 MG tablet Take 1 tablet (20 mg total) by mouth daily. 90 tablet 0   Blood Glucose Monitoring Suppl (AGAMATRIX PRESTO PRO METER) DEVI Check blood sugar twice daily before breakfast and dinner 1 Device 0   glipiZIDE (GLUCOTROL) 5 MG tablet Take 1 tablet (5 mg total) by mouth 2 (two) times daily before a meal. 60 tablet 2   glucose blood (AGAMATRIX PRESTO TEST) test strip Check sugars twice daily before meals (Patient taking differently: Check sugars once daily before meals) 60 each 12   Iron, Ferrous Sulfate, 325 (65 Fe) MG TABS Take 325 mg by mouth daily. (Patient not taking: Reported on 09/01/2022) 30 tablet 1   lisinopril (ZESTRIL) 30 MG tablet TAKE 1 TABLET BY MOUTH ONCE DAILY. 90 tablet 0   metFORMIN (GLUCOPHAGE-XR) 500 MG 24 hr tablet Take 1 tablet (500 mg total) by mouth 2 (two) times daily.  180 tablet 0   metoprolol tartrate (LOPRESSOR) 25 MG tablet Take 1/2 (one-half) tablet by mouth twice daily 90 tablet 2   Multiple Vitamin (MULTIVITAMIN) tablet Take 1 tablet by mouth daily.     Current Facility-Administered Medications  Medication Dose Route Frequency Provider Last Rate Last Admin   0.9 %  sodium chloride infusion  500 mL Intravenous Once Jenel Lucks, MD        Allergies as of 09/09/2022   (No Known Allergies)    Family History  Problem Relation Age of Onset   Hypertension Sister    Edema Sister    Diabetes Maternal Grandmother     Cancer Paternal Grandmother        cervical   Asthma Daughter    Eczema Daughter    Heart failure Other    Breast cancer Neg Hx    Colon cancer Neg Hx    Colon polyps Neg Hx    Esophageal cancer Neg Hx    Rectal cancer Neg Hx    Stomach cancer Neg Hx     Social History   Socioeconomic History   Marital status: Significant Other    Spouse name: Jennifer Dunn   Number of children: 4   Years of education: 12   Highest education level: High school graduate  Occupational History   Occupation: Production designer, theatre/television/film  Tobacco Use   Smoking status: Never    Passive exposure: Never   Smokeless tobacco: Never  Vaping Use   Vaping Use: Never used  Substance and Sexual Activity   Alcohol use: Never   Drug use: Never   Sexual activity: Yes    Birth control/protection: Condom  Other Topics Concern   Not on file  Social History Narrative   Has been with Jennifer Dunn, a patient here, since 2003   They have one child together.   Lives at home with Jennifer Dunn and her 3 youngest children   Her oldest child is in [place].     Patient is already a grandmother.   Social Determinants of Health   Financial Resource Strain: Low Risk  (06/22/2017)   Overall Financial Resource Strain (CARDIA)    Difficulty of Paying Living Expenses: Not very hard  Food Insecurity: Food Insecurity Present (06/10/2022)   Hunger Vital Sign    Worried About Running Out of Food in the Last Year: Often true    Ran Out of Food in the Last Year: Often true  Transportation Needs: No Transportation Needs (06/10/2022)   PRAPARE - Administrator, Civil Service (Medical): No    Lack of Transportation (Non-Medical): No  Physical Activity: Inactive (03/30/2017)   Exercise Vital Sign    Days of Exercise per Week: 0 days    Minutes of Exercise per Session: 0 min  Stress: No Stress Concern Present (03/30/2017)   [employer] of Occupational Health - Occupational Stress Questionnaire    Feeling of Stress : Only a  little  Social Connections: Moderately Integrated (03/30/2017)   Social Connection and Isolation Panel [NHANES]    Frequency of Communication with Friends and Family: More than three times a week    Frequency of Social Gatherings with Friends and Family: Once a week    Attends Religious Services: 1 to 4 times per year    Active Member of [employer] or Organizations: No    Attends Banker Meetings: Never    Marital Status: Living with partner  Intimate Partner Violence: Not At Risk (06/10/2022)   Humiliation,  Afraid, Rape, and Kick questionnaire    Fear of Current or Ex-Partner: No    Emotionally Abused: No    Physically Abused: No    Sexually Abused: No    Review of Systems:  All other review of systems negative except as mentioned in the HPI.  Physical Exam: Vital signs BP 124/68   Pulse 88   Temp 98.9 F (37.2 C) (Temporal)   Ht 5\' 1"  (1.549 m)   Wt 193 lb (87.5 kg)   SpO2 98%   BMI 36.47 kg/m   General:   Alert,  Well-developed, well-nourished, pleasant and cooperative in NAD Airway:  Mallampati 2 Lungs:  Clear throughout to auscultation.   Heart:  Regular rate and rhythm; no murmurs, clicks, rubs,  or gallops. Abdomen:  Soft, nontender and nondistended. Normal bowel sounds.   Neuro/Psych:  Normal mood and affect. A and O x 3   Noelene Gang E. Tomasa Rand, MD [facility]

## 2022-09-09 NOTE — Op Note (Signed)
[facility] Patient Name: Jennifer Dunn Procedure Date: 09/09/2022 1:39 PM MRN: [id number] Endoscopist: Lorin Picket E. Tomasa Rand , MD, [id number] Age: 47 Referring MD:  Date of Birth: 06-14-1975 Gender: Female Account #: [id number] Procedure:                Colonoscopy Indications:              Screening for colorectal malignant neoplasm, This                            is the patient's first colonoscopy Medicines:                Monitored Anesthesia Care Procedure:                Pre-Anesthesia Assessment:                           - Prior to the procedure, a History and Physical                            was performed, and patient medications and                            allergies were reviewed. The patient's tolerance of                            previous anesthesia was also reviewed. The risks                            and benefits of the procedure and the sedation                            options and risks were discussed with the patient.                            All questions were answered, and informed consent                            was obtained. Prior Anticoagulants: The patient has                            taken no anticoagulant or antiplatelet agents. ASA                            Grade Assessment: II - A patient with mild systemic                            disease. After reviewing the risks and benefits,                            the patient was deemed in satisfactory condition to                            undergo the procedure.  After obtaining informed consent, the colonoscope                            was passed under direct vision. Throughout the                            procedure, the patient's blood pressure, pulse, and                            oxygen saturations were monitored continuously. The                            Olympus CF-HQ190L [id number]) Colonoscope was                            introduced  through the anus and advanced to the the                            cecum, identified by appendiceal orifice and                            ileocecal valve. The colonoscopy was performed                            without difficulty. The patient tolerated the                            procedure well. The bowel preparation used was                            SUPREP via split dose instruction. The quality of                            the bowel preparation was excellent. The ileocecal                            valve, appendiceal orifice, and rectum were                            photographed. Scope In: 1:44:18 PM Scope Out: 1:56:46 PM Scope Withdrawal Time: 0 hours 7 minutes 16 seconds  Total Procedure Duration: 0 hours 12 minutes 28 seconds  Findings:                 The perianal and digital rectal examinations were                            normal. Pertinent negatives include normal                            sphincter tone and no palpable rectal lesions.                           The colon (entire examined portion) appeared normal.  The retroflexed view of the distal rectum and anal                            verge was normal and showed no anal or rectal                            abnormalities. Complications:            No immediate complications. Estimated Blood Loss:     Estimated blood loss: none. Impression:               - The entire examined colon is normal.                           - The distal rectum and anal verge are normal on                            retroflexion view.                           - No specimens collected. Recommendation:           - Patient has a contact number available for                            emergencies. The signs and symptoms of potential                            delayed complications were discussed with the                            patient. Return to normal activities tomorrow.                            Written  discharge instructions were provided to the                            patient.                           - Resume previous diet.                           - Continue present medications.                           - Repeat colonoscopy in 10 years for screening                            purposes. Jennifer Dunn E. Tomasa Rand, MD 09/09/2022 2:00:42 PM This report has been signed electronically.

## 2022-09-09 NOTE — Progress Notes (Signed)
Interpreter, Chyrl Civatte, in recovery with patient.

## 2022-09-09 NOTE — Progress Notes (Unsigned)
Report to PACU, RN, vss, BBS= Clear.  

## 2022-09-09 NOTE — Progress Notes (Unsigned)
Pt's states no medical or surgical changes since previsit or office visit. 

## 2022-09-09 NOTE — Patient Instructions (Signed)
YOU HAD AN ENDOSCOPIC PROCEDURE TODAY AT THE [facility]:   Refer to the procedure report that was given to you for any specific questions about what was found during the examination.  If the procedure report does not answer your questions, please call your gastroenterologist to clarify.  If you requested that your care partner not be given the details of your procedure findings, then the procedure report has been included in a sealed envelope for you to review at your convenience later.  YOU SHOULD EXPECT: Some feelings of bloating in the abdomen. Passage of more gas than usual.  Walking can help get rid of the air that was put into your GI tract during the procedure and reduce the bloating. If you had a lower endoscopy (such as a colonoscopy or flexible sigmoidoscopy) you may notice spotting of blood in your stool or on the toilet paper. If you underwent a bowel prep for your procedure, you may not have a normal bowel movement for a few days.  Please Note:  You might notice some irritation and congestion in your nose or some drainage.  This is from the oxygen used during your procedure.  There is no need for concern and it should clear up in a day or so.  SYMPTOMS TO REPORT IMMEDIATELY:  Following lower endoscopy (colonoscopy or flexible sigmoidoscopy):  Excessive amounts of blood in the stool  Significant tenderness or worsening of abdominal pains  Swelling of the abdomen that is new, acute  Fever of 100F or higher  For urgent or emergent issues, a gastroenterologist can be reached at any hour by calling [phone]. Do not use MyChart messaging for urgent concerns.    DIET:  We do recommend a small meal at first, but then you may proceed to your regular diet.  Drink plenty of fluids but you should avoid alcoholic beverages for 24 hours.  ACTIVITY:  You should plan to take it easy for the rest of today and you should NOT DRIVE or use heavy machinery until tomorrow (because of  the sedation medicines used during the test).    FOLLOW UP: Our staff will call the number listed on your records the next business day following your procedure.  We will call around 7:15- 8:00 am to check on you and address any questions or concerns that you may have regarding the information given to you following your procedure. If we do not reach you, we will leave a message.     If any biopsies were taken you will be contacted by phone or by letter within the next 1-3 weeks.  Please call us at [phone] if you have not heard about the biopsies in 3 weeks.    SIGNATURES/CONFIDENTIALITY: You and/or your care partner have signed paperwork which will be entered into your electronic medical record.  These signatures attest to the fact that that the information above on your After Visit Summary has been reviewed and is understood.  Full responsibility of the confidentiality of this discharge information lies with you and/or your care-partner.  

## 2022-09-13 ENCOUNTER — Telehealth: Payer: Self-pay | Admitting: *Deleted

## 2022-09-13 NOTE — Telephone Encounter (Signed)
Left message on f/u call 

## 2022-10-14 LAB — LAB REPORT - SCANNED
A1c: 11.3
Albumin, Urine POC: 727.3
Creatinine, POC: 69.9 mg/dL
EGFR: 37
Microalb Creat Ratio: 1040

## 2022-11-07 ENCOUNTER — Other Ambulatory Visit (HOSPITAL_BASED_OUTPATIENT_CLINIC_OR_DEPARTMENT_OTHER): Payer: Self-pay

## 2022-11-11 ENCOUNTER — Other Ambulatory Visit (HOSPITAL_COMMUNITY): Payer: Self-pay | Admitting: *Deleted

## 2022-11-15 ENCOUNTER — Encounter (HOSPITAL_COMMUNITY): Payer: Self-pay

## 2022-11-21 ENCOUNTER — Ambulatory Visit: Payer: Self-pay

## 2022-11-22 ENCOUNTER — Encounter (HOSPITAL_COMMUNITY): Payer: Self-pay

## 2023-02-27 ENCOUNTER — Other Ambulatory Visit: Payer: Self-pay | Admitting: Family

## 2023-02-27 DIAGNOSIS — E785 Hyperlipidemia, unspecified: Secondary | ICD-10-CM

## 2023-02-27 NOTE — Telephone Encounter (Signed)
 Complete. Schedule appointment.

## 2023-03-26 ENCOUNTER — Other Ambulatory Visit: Payer: Self-pay | Admitting: Family

## 2023-03-26 DIAGNOSIS — E119 Type 2 diabetes mellitus without complications: Secondary | ICD-10-CM

## 2023-03-27 ENCOUNTER — Telehealth: Payer: Self-pay | Admitting: Family

## 2023-03-27 NOTE — Telephone Encounter (Signed)
Called pt to schedule appt for med refill per CMA Curley Spice. Pt contact number is invalid

## 2023-07-30 ENCOUNTER — Other Ambulatory Visit: Payer: Self-pay | Admitting: Family

## 2023-07-30 DIAGNOSIS — E119 Type 2 diabetes mellitus without complications: Secondary | ICD-10-CM

## 2023-07-30 DIAGNOSIS — I10 Essential (primary) hypertension: Secondary | ICD-10-CM

## 2023-08-02 ENCOUNTER — Encounter: Payer: Self-pay | Admitting: Family

## 2023-08-02 NOTE — Progress Notes (Signed)
 Erroneous encounter-disregard

## 2023-08-24 ENCOUNTER — Other Ambulatory Visit: Payer: Self-pay | Admitting: Family

## 2023-08-24 DIAGNOSIS — E785 Hyperlipidemia, unspecified: Secondary | ICD-10-CM

## 2023-11-14 ENCOUNTER — Other Ambulatory Visit: Payer: Self-pay

## 2023-11-14 ENCOUNTER — Ambulatory Visit (INDEPENDENT_AMBULATORY_CARE_PROVIDER_SITE_OTHER): Payer: Self-pay

## 2023-11-14 VITALS — BP 128/84 | HR 77 | Temp 98.1°F | Resp 16 | Ht 62.0 in | Wt 184.4 lb

## 2023-11-14 DIAGNOSIS — E1159 Type 2 diabetes mellitus with other circulatory complications: Secondary | ICD-10-CM

## 2023-11-14 DIAGNOSIS — I152 Hypertension secondary to endocrine disorders: Secondary | ICD-10-CM

## 2023-11-14 DIAGNOSIS — E785 Hyperlipidemia, unspecified: Secondary | ICD-10-CM

## 2023-11-14 DIAGNOSIS — E119 Type 2 diabetes mellitus without complications: Secondary | ICD-10-CM

## 2023-11-14 DIAGNOSIS — Z7689 Persons encountering health services in other specified circumstances: Secondary | ICD-10-CM

## 2023-11-14 DIAGNOSIS — Z789 Other specified health status: Secondary | ICD-10-CM | POA: Insufficient documentation

## 2023-11-14 DIAGNOSIS — Z7984 Long term (current) use of oral hypoglycemic drugs: Secondary | ICD-10-CM

## 2023-11-14 LAB — POCT GLYCOSYLATED HEMOGLOBIN (HGB A1C): Hemoglobin A1C: 15 % — AB (ref 4.0–5.6)

## 2023-11-14 MED ORDER — CANAGLIFLOZIN-METFORMIN HCL 50-1000 MG PO TABS
1.0000 | ORAL_TABLET | Freq: Two times a day (BID) | ORAL | 1 refills | Status: DC
  Filled 2023-11-14: qty 180, 90d supply, fill #0

## 2023-11-14 MED ORDER — ATORVASTATIN CALCIUM 20 MG PO TABS
20.0000 mg | ORAL_TABLET | Freq: Every day | ORAL | 1 refills | Status: AC
  Filled 2023-11-14: qty 90, 90d supply, fill #0
  Filled 2024-05-01: qty 90, 90d supply, fill #1

## 2023-11-14 MED ORDER — LISINOPRIL 40 MG PO TABS
40.0000 mg | ORAL_TABLET | Freq: Every day | ORAL | 1 refills | Status: AC
  Filled 2023-11-14: qty 90, 90d supply, fill #0
  Filled 2024-05-01: qty 90, 90d supply, fill #1

## 2023-11-14 NOTE — Progress Notes (Addendum)
 Patient ID: Jennifer Dunn, female    DOB: 11-01-1975  MRN: [id number]  CC: Annual Exam (-Patient is here to have annually  complete physical examination /-Care gap address/-labs taken )   Subjective: Jennifer Dunn is a 48 y.o. female with past medical history of Type 2 diabetes, iron  deficiency anemia, hypertension who presents to clinic for annual exam. Pt has been off her diabetes medications for 3 months and needs refills. Reports she is having some vision changes and would like a referral to eye doctor. Denies shortness of breath, chest pain, changes in urination habits.    Patient Active Problem List   Diagnosis Date Noted   Uses Spanish as primary spoken language 11/14/2023   Anemia 07/29/2022   Microalbuminuria due to type 2 diabetes mellitus (HCC) 06/24/2020   Plantar fasciitis, right 02/25/2019   Neck pain, bilateral 02/25/2019   Iron  deficiency anemia 02/19/2019   Screening breast examination 10/30/2018   Breast pain, left 10/30/2018   Obesity    Obesity (BMI 30.0-34.9)    Hyperlipidemia 02/21/2017   Stress 02/21/2017   Type 2 diabetes mellitus without complication, without long-term current use of insulin (HCC) 01/06/2016   Hypertension associated with diabetes (HCC) 01/06/2016      No Known Allergies   ROS: Review of Systems Negative except as stated above  PHYSICAL EXAM: BP 128/84   Pulse 77   Temp 98.1 F (36.7 C) (Oral)   Resp 16   Ht 5' 2 (1.575 m)   Wt 184 lb 6.4 oz (83.6 kg)   SpO2 95%   BMI 33.73 kg/m   Physical Exam  General: well-appearing, no acute distress Skin: no jaundice, rashes, or lesions Cardiovascular: regular heart rate and rhythm, normal S1/S2, no murmurs, gallops, or rubs, peripheral pulses 2+ bilaterally Chest: no skeletal deformity, lungs clear to auscultation bilaterally, equal breath sounds bilaterally Abdomen: soft, non-distended, non-tender to palpation, no hepatomegaly, no splenomegaly, normoactive bowel  sounds Musculoskeletal: normal gait Extremities: no peripheral edema, nails intact   ASSESSMENT AND PLAN:  1. Type 2 diabetes mellitus without complication, without long-term current use of insulin (HCC) (Primary) - Extensive counseling provided for importance of maintaining blood glucose control to prevent adverse effects. Recommended initiation of Insuline therapy due to high A1C but pt would like to trial oral medication management and lifestyle changes first.  - Hb A1C today is 15. Not at goal.  - Start Canagliflozin -metFORMIN  HCl 50-1000 MG TABS; Take 1 tablet by mouth 2 (two) times daily.  Dispense: 180 tablet; Refill: 1 - Ambulatory referral to Ophthalmology for yearly eye exam.  2. Hyperlipidemia, unspecified hyperlipidemia type - Lipid Panel ordered to further assess  - Continue atorvastatin  (LIPITOR) 20 MG tablet; Take 1 tablet (20 mg total) by mouth daily.  Dispense: 90 tablet; Refill: 1  3. Hypertension associated with diabetes (HCC) - At goal on today's visit - Continue lisinopril  (ZESTRIL ) 40 MG tablet; Take 1 tablet (40 mg total) by mouth daily.  Dispense: 90 tablet; Refill: 1  4. Encounter to establish care with new provider - Routine Labs - CBC - Comprehensive metabolic panel - Lipid panel - TSH - Physical exam scheduled for next follow up 5. Uses Spanish as primary spoken language     Patient was given the opportunity to ask questions.  Patient verbalized understanding of the plan and was able to repeat key elements of the plan. Patient was given clear instructions to go to Emergency Department or return to medical center if symptoms don't improve, worsen,  or new problems develop.The patient verbalized understanding.   Orders Placed This Encounter  Procedures   Urine Albumin/Creatinine with ratio (send out) [LAB689]   CBC   Comprehensive metabolic panel   Lipid panel   TSH   Ambulatory referral to Ophthalmology   POCT glycosylated hemoglobin (Hb A1C)      Requested Prescriptions   Signed Prescriptions Disp Refills   atorvastatin  (LIPITOR) 20 MG tablet 90 tablet 1    Sig: Take 1 tablet (20 mg total) by mouth daily.   lisinopril  (ZESTRIL ) 40 MG tablet 90 tablet 1    Sig: Take 1 tablet (40 mg total) by mouth daily.   Canagliflozin -metFORMIN  HCl 50-1000 MG TABS 180 tablet 1    Sig: Take 1 tablet by mouth 2 (two) times daily.    Return in about 1 month (around 12/15/2023) for physical.  Sula Leavy Rode, PA-C

## 2023-11-15 ENCOUNTER — Telehealth: Payer: Self-pay

## 2023-11-15 LAB — LIPID PANEL
Chol/HDL Ratio: 4.3 ratio (ref 0.0–4.4)
Cholesterol, Total: 207 mg/dL — ABNORMAL HIGH (ref 100–199)
HDL: 48 mg/dL (ref >39)
LDL Chol Calc (NIH): 108 mg/dL — ABNORMAL HIGH (ref 0–99)
Triglycerides: 299 mg/dL — ABNORMAL HIGH (ref 0–149)
VLDL Cholesterol Cal: 51 mg/dL — ABNORMAL HIGH (ref 5–40)

## 2023-11-15 LAB — MICROALBUMIN / CREATININE URINE RATIO
Creatinine, Urine: 52.8 mg/dL
Microalb/Creat Ratio: 1078 mg/g{creat} — ABNORMAL HIGH (ref 0–29)
Microalbumin, Urine: 569.3 ug/mL

## 2023-11-15 LAB — COMPREHENSIVE METABOLIC PANEL WITH GFR
ALT: 43 IU/L — ABNORMAL HIGH (ref 0–32)
AST: 42 IU/L — ABNORMAL HIGH (ref 0–40)
Albumin: 4 g/dL (ref 3.9–4.9)
Alkaline Phosphatase: 168 IU/L — ABNORMAL HIGH (ref 44–121)
BUN/Creatinine Ratio: 18 (ref 9–23)
BUN: 27 mg/dL — ABNORMAL HIGH (ref 6–24)
Bilirubin Total: <0.2 mg/dL (ref 0.0–1.2)
CO2: 21 mmol/L (ref 20–29)
Calcium: 9.2 mg/dL (ref 8.7–10.2)
Chloride: 96 mmol/L (ref 96–106)
Creatinine, Ser: 1.51 mg/dL — ABNORMAL HIGH (ref 0.57–1.00)
Globulin, Total: 3.5 g/dL (ref 1.5–4.5)
Glucose: 453 mg/dL — ABNORMAL HIGH (ref 70–99)
Potassium: 4.7 mmol/L (ref 3.5–5.2)
Sodium: 132 mmol/L — ABNORMAL LOW (ref 134–144)
Total Protein: 7.5 g/dL (ref 6.0–8.5)
eGFR: 42 mL/min/1.73 — ABNORMAL LOW (ref >59)

## 2023-11-15 LAB — CBC
Hematocrit: 38.3 % (ref 34.0–46.6)
Hemoglobin: 11.4 g/dL (ref 11.1–15.9)
MCH: 25.3 pg — ABNORMAL LOW (ref 26.6–33.0)
MCHC: 29.8 g/dL — ABNORMAL LOW (ref 31.5–35.7)
MCV: 85 fL (ref 79–97)
Platelets: 244 x10E3/uL (ref 150–450)
RBC: 4.51 x10E6/uL (ref 3.77–5.28)
RDW: 15.2 % (ref 11.7–15.4)
WBC: 7.1 x10E3/uL (ref 3.4–10.8)

## 2023-11-15 LAB — TSH: TSH: 3.42 u[IU]/mL (ref 0.450–4.500)

## 2023-11-15 NOTE — Telephone Encounter (Signed)
 A document form has been faxed: [employer]  Patient Assistance Application, to be filled out by provider. Send document back via EMAIL within 7-days. Document is located in providers tray at front office.        [email or web address]

## 2023-11-16 ENCOUNTER — Other Ambulatory Visit: Payer: Self-pay

## 2023-11-16 ENCOUNTER — Ambulatory Visit: Payer: Self-pay

## 2023-11-16 NOTE — Progress Notes (Signed)
 Resultados de laboratorio.  Su Azucar/Glucosa esta muy alta! Trate de comenzar la medicina que ordenamos lo mas pronto posible. Sus niveles de azucar alto estan afectando sus riones por eso esta botando mucha proteina por la orina. Avisenos si tiene problemas obteniendo la medicina.

## 2023-11-20 ENCOUNTER — Other Ambulatory Visit: Payer: Self-pay

## 2023-11-21 ENCOUNTER — Other Ambulatory Visit: Payer: Self-pay

## 2023-11-22 ENCOUNTER — Other Ambulatory Visit: Payer: Self-pay

## 2023-11-23 ENCOUNTER — Other Ambulatory Visit: Payer: Self-pay

## 2023-12-11 ENCOUNTER — Other Ambulatory Visit: Payer: Self-pay

## 2023-12-11 DIAGNOSIS — E119 Type 2 diabetes mellitus without complications: Secondary | ICD-10-CM

## 2023-12-11 MED ORDER — GLIPIZIDE ER 5 MG PO TB24
5.0000 mg | ORAL_TABLET | Freq: Every day | ORAL | 1 refills | Status: AC
  Filled 2023-12-11 – 2024-01-16 (×3): qty 90, 90d supply, fill #0

## 2023-12-11 MED ORDER — METFORMIN HCL 1000 MG PO TABS
1000.0000 mg | ORAL_TABLET | Freq: Two times a day (BID) | ORAL | 1 refills | Status: AC
  Filled 2023-12-11 – 2024-01-16 (×3): qty 180, 90d supply, fill #0

## 2023-12-11 NOTE — Progress Notes (Signed)
 Given most recent GFR of 42 and pt's difficulty obtaining medication, discontinuing prescription for Invokamet . Ordering Glipizide  + Metformin .

## 2023-12-21 ENCOUNTER — Other Ambulatory Visit: Payer: Self-pay

## 2024-01-03 ENCOUNTER — Other Ambulatory Visit: Payer: Self-pay

## 2024-01-10 ENCOUNTER — Other Ambulatory Visit: Payer: Self-pay

## 2024-01-12 ENCOUNTER — Other Ambulatory Visit: Payer: Self-pay

## 2024-01-16 ENCOUNTER — Other Ambulatory Visit: Payer: Self-pay

## 2024-04-08 ENCOUNTER — Other Ambulatory Visit: Payer: Self-pay

## 2024-05-01 ENCOUNTER — Other Ambulatory Visit: Payer: Self-pay
